# Patient Record
Sex: Female | Born: 1960 | Race: White | Hispanic: No | Marital: Married | Smoking: Never smoker
Health system: Southern US, Community
[De-identification: ages and names within clinical notes are randomized; demographics above are authoritative.]

## PROBLEM LIST (undated history)

## (undated) DIAGNOSIS — J45909 Unspecified asthma, uncomplicated: Secondary | ICD-10-CM

## (undated) DIAGNOSIS — C801 Malignant (primary) neoplasm, unspecified: Secondary | ICD-10-CM

## (undated) DIAGNOSIS — F419 Anxiety disorder, unspecified: Secondary | ICD-10-CM

## (undated) DIAGNOSIS — I10 Essential (primary) hypertension: Secondary | ICD-10-CM

## (undated) DIAGNOSIS — E785 Hyperlipidemia, unspecified: Secondary | ICD-10-CM

## (undated) DIAGNOSIS — T7840XA Allergy, unspecified, initial encounter: Secondary | ICD-10-CM

## (undated) HISTORY — PX: OTHER SURGICAL HISTORY: SHX169

## (undated) HISTORY — DX: Essential (primary) hypertension: I10

## (undated) HISTORY — DX: Allergy, unspecified, initial encounter: T78.40XA

## (undated) HISTORY — DX: Hyperlipidemia, unspecified: E78.5

## (undated) HISTORY — DX: Unspecified asthma, uncomplicated: J45.909

---

## 2000-03-15 HISTORY — PX: BREAST LUMPECTOMY: SHX2

## 2008-03-15 DIAGNOSIS — J189 Pneumonia, unspecified organism: Secondary | ICD-10-CM

## 2008-03-15 HISTORY — DX: Pneumonia, unspecified organism: J18.9

## 2021-01-19 ENCOUNTER — Encounter: Payer: Self-pay | Admitting: *Deleted

## 2021-01-19 ENCOUNTER — Telehealth: Payer: Self-pay | Admitting: Hematology and Oncology

## 2021-01-19 ENCOUNTER — Telehealth: Payer: Self-pay | Admitting: *Deleted

## 2021-01-19 NOTE — Telephone Encounter (Signed)
Confirmed [facility] for 01/21/21 at 1215 .  Instructions and contact information given.

## 2021-01-19 NOTE — Telephone Encounter (Signed)
Called patient on 11/3 and 11/7 and left message for patient to return call to confirm upcoming clinic appointment on 11/9 at 1215

## 2021-01-20 ENCOUNTER — Encounter: Payer: Self-pay | Admitting: *Deleted

## 2021-01-20 ENCOUNTER — Other Ambulatory Visit: Payer: Self-pay | Admitting: *Deleted

## 2021-01-20 DIAGNOSIS — C50412 Malignant neoplasm of upper-outer quadrant of left female breast: Secondary | ICD-10-CM | POA: Insufficient documentation

## 2021-01-20 NOTE — Progress Notes (Signed)
[facility] NOTE  Patient Care Team: Pcp, No as PCP - General [name], MD as Consulting Physician (General Surgery) [name], MD as Consulting Physician (Hematology and Oncology) Kyung Rudd, MD as Consulting Physician (Radiation Oncology)  CHIEF COMPLAINTS/PURPOSE OF CONSULTATION:  Newly diagnosed left breast cancer  HISTORY OF PRESENTING ILLNESS:  Leah Chan 60 y.o. female is here because of recent diagnosis of malignant neoplasm of UOQ of the left breast. She presents to the clinic today for initial evaluation and discussion of treatment options.  She denies any lumps or nodules in the breast.  Denies any pain or discomfort.  I reviewed her records extensively and collaborated the history with the patient.  SUMMARY OF ONCOLOGIC HISTORY: Oncology History  Malignant neoplasm of upper-outer quadrant of left breast in female, estrogen receptor negative ([place])  01/20/2021 Initial Diagnosis   Screening mammogram detected 2 asymmetric areas with calcifications detected in March 2022.  October 2022 mammogram revealed left breast calcifications 1.8 cm at 1 o'clock position and a mass below the skin measuring 4 mm.  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  MEDICATIONS:  Current Outpatient Medications  Medication Sig Dispense Refill   Calcium-Magnesium-Zinc 333-133-5 MG TABS Take 1 tablet by mouth.     escitalopram (LEXAPRO) 10 MG tablet Take 10 mg by mouth daily.     hydrochlorothiazide (HYDRODIURIL) 25 MG tablet Take 1 tablet by mouth  daily.     hydrOXYzine (ATARAX/VISTARIL) 25 MG tablet TAKE 1 AND 1/2 TABLETS BY MOUTH EVERY NIGHT FOR SLEEP     pravastatin (PRAVACHOL) 10 MG tablet Take 10 mg by mouth at bedtime.     No current facility-administered medications for this visit.    REVIEW OF SYSTEMS:     All other systems were reviewed with the patient and are negative.  PHYSICAL EXAMINATION: ECOG PERFORMANCE STATUS: 1 - Symptomatic but completely ambulatory  Vitals:   01/21/21 1304  BP: 139/76  Pulse: 68  Resp: 17  Temp: 97.8 F (36.6 C)  SpO2: 100%   Filed Weights   01/21/21 1304  Weight: 131 lb 11.2 oz (59.7 kg)      LABORATORY DATA:  I have reviewed the data as listed Lab Results  Component Value Date   WBC 6.9 01/21/2021   HGB 14.3 01/21/2021   HCT 41.9 01/21/2021   MCV 86.0 01/21/2021   PLT 222 01/21/2021   Lab Results  Component Value Date   NA 139 01/21/2021   K 3.7 01/21/2021   CL 101 01/21/2021   CO2 30 01/21/2021    RADIOGRAPHIC STUDIES: I have personally reviewed the radiological reports and agreed with the findings in the report.  ASSESSMENT AND PLAN:  Malignant neoplasm of upper-outer quadrant of left breast in female, estrogen receptor negative ([place]) Screening mammogram detected 2 asymmetric areas with calcifications detected in March 2022.  October 2022 mammogram revealed left breast calcifications 1.8 cm at 1 o'clock position and a mass below the skin measuring 4 mm.  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 After reviewing the pathology in detail, we proceeded to discuss the different treatment options between surgery, radiation, chemotherapy, antiestrogen  therapies.  Treatment plan: 1.  Await the final tumor size before determining if systemic chemotherapy would be necessary 2. adjuvant radiation  Genetic counseling and testing  Return to clinic after surgery to discuss the final pathology report.   All questions were answered. The patient knows to call the clinic with any problems, questions or concerns.   Rulon Eisenmenger, MD, MPH 01/21/2021    I, Thana Ates, am acting as scribe for [name], MD.  I have reviewed the above documentation for accuracy and completeness, and I agree with the above.

## 2021-01-21 ENCOUNTER — Encounter: Payer: Self-pay | Admitting: *Deleted

## 2021-01-21 ENCOUNTER — Ambulatory Visit (HOSPITAL_BASED_OUTPATIENT_CLINIC_OR_DEPARTMENT_OTHER): Payer: Managed Care, Other (non HMO) | Admitting: Genetic Counselor

## 2021-01-21 ENCOUNTER — Encounter: Payer: Self-pay | Admitting: Physical Therapy

## 2021-01-21 ENCOUNTER — Inpatient Hospital Stay: Payer: Managed Care, Other (non HMO) | Admitting: Hematology and Oncology

## 2021-01-21 ENCOUNTER — Inpatient Hospital Stay: Payer: Managed Care, Other (non HMO)

## 2021-01-21 ENCOUNTER — Ambulatory Visit: Payer: Managed Care, Other (non HMO) | Attending: Surgery | Admitting: Physical Therapy

## 2021-01-21 ENCOUNTER — Encounter: Payer: Self-pay | Admitting: General Practice

## 2021-01-21 ENCOUNTER — Other Ambulatory Visit: Payer: Self-pay

## 2021-01-21 ENCOUNTER — Ambulatory Visit
Admission: RE | Admit: 2021-01-21 | Discharge: 2021-01-21 | Disposition: A | Discharge status: Home / Self Care | Payer: Managed Care, Other (non HMO) | Source: Ambulatory Visit | Attending: Radiation Oncology | Admitting: Radiation Oncology

## 2021-01-21 ENCOUNTER — Other Ambulatory Visit: Payer: Self-pay | Admitting: Surgery

## 2021-01-21 DIAGNOSIS — Z8042 Family history of malignant neoplasm of prostate: Secondary | ICD-10-CM

## 2021-01-21 DIAGNOSIS — J45909 Unspecified asthma, uncomplicated: Secondary | ICD-10-CM | POA: Insufficient documentation

## 2021-01-21 DIAGNOSIS — Z171 Estrogen receptor negative status [ER-]: Secondary | ICD-10-CM

## 2021-01-21 DIAGNOSIS — C50412 Malignant neoplasm of upper-outer quadrant of left female breast: Secondary | ICD-10-CM | POA: Insufficient documentation

## 2021-01-21 DIAGNOSIS — R293 Abnormal posture: Secondary | ICD-10-CM | POA: Diagnosis present

## 2021-01-21 DIAGNOSIS — Z79899 Other long term (current) drug therapy: Secondary | ICD-10-CM | POA: Insufficient documentation

## 2021-01-21 DIAGNOSIS — Z923 Personal history of irradiation: Secondary | ICD-10-CM | POA: Insufficient documentation

## 2021-01-21 DIAGNOSIS — Z170 Estrogen receptor positive status [ER+]: Secondary | ICD-10-CM | POA: Diagnosis present

## 2021-01-21 DIAGNOSIS — I10 Essential (primary) hypertension: Secondary | ICD-10-CM | POA: Insufficient documentation

## 2021-01-21 DIAGNOSIS — Z808 Family history of malignant neoplasm of other organs or systems: Secondary | ICD-10-CM | POA: Diagnosis not present

## 2021-01-21 DIAGNOSIS — Z853 Personal history of malignant neoplasm of breast: Secondary | ICD-10-CM

## 2021-01-21 LAB — CBC WITH DIFFERENTIAL (CANCER CENTER ONLY)
Abs Immature Granulocytes: 0.01 10*3/uL (ref 0.00–0.07)
Basophils Absolute: 0.1 10*3/uL (ref 0.0–0.1)
Basophils Relative: 1 %
Eosinophils Absolute: 0.1 10*3/uL (ref 0.0–0.5)
Eosinophils Relative: 2 %
HCT: 41.9 % (ref 36.0–46.0)
Hemoglobin: 14.3 g/dL (ref 12.0–15.0)
Immature Granulocytes: 0 %
Lymphocytes Relative: 27 %
Lymphs Abs: 1.8 10*3/uL (ref 0.7–4.0)
MCH: 29.4 pg (ref 26.0–34.0)
MCHC: 34.1 g/dL (ref 30.0–36.0)
MCV: 86 fL (ref 80.0–100.0)
Monocytes Absolute: 0.4 10*3/uL (ref 0.1–1.0)
Monocytes Relative: 6 %
Neutro Abs: 4.4 10*3/uL (ref 1.7–7.7)
Neutrophils Relative %: 64 %
Platelet Count: 222 10*3/uL (ref 150–400)
RBC: 4.87 MIL/uL (ref 3.87–5.11)
RDW: 13.2 % (ref 11.5–15.5)
WBC Count: 6.9 10*3/uL (ref 4.0–10.5)
nRBC: 0 % (ref 0.0–0.2)

## 2021-01-21 LAB — CMP (CANCER CENTER ONLY)
ALT: 16 U/L (ref 0–44)
AST: 22 U/L (ref 15–41)
Albumin: 4.4 g/dL (ref 3.5–5.0)
Alkaline Phosphatase: 76 U/L (ref 38–126)
Anion gap: 8 (ref 5–15)
BUN: 11 mg/dL (ref 8–23)
CO2: 30 mmol/L (ref 22–32)
Calcium: 9.5 mg/dL (ref 8.9–10.3)
Chloride: 101 mmol/L (ref 98–111)
Creatinine: 0.87 mg/dL (ref 0.44–1.00)
GFR, Estimated: >60 mL/min (ref >60)
Glucose, Bld: 78 mg/dL (ref 70–99)
Potassium: 3.7 mmol/L (ref 3.5–5.1)
Sodium: 139 mmol/L (ref 135–145)
Total Bilirubin: 1.1 mg/dL (ref 0.3–1.2)
Total Protein: 7.5 g/dL (ref 6.5–8.1)

## 2021-01-21 LAB — GENETIC SCREENING ORDER

## 2021-01-21 NOTE — Assessment & Plan Note (Signed)
Screening mammogram detected 2 asymmetric areas with calcifications detected in March 2022.  October 2022 mammogram revealed left breast calcifications 1.8 cm at 1 o'clock position and a mass below the skin measuring 4 mm.  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 After reviewing the pathology in detail, we proceeded to discuss the different treatment options between surgery, radiation, chemotherapy, antiestrogen therapies.  Treatment plan: 1.  Await the final tumor size before determining if systemic chemotherapy would be necessary 2. adjuvant radiation  Genetic counseling and testing  Return to clinic after surgery to discuss the final pathology report.

## 2021-01-21 NOTE — Patient Instructions (Signed)

## 2021-01-21 NOTE — Progress Notes (Signed)
[place] Psychosocial Distress Screening Spiritual Care  Met with Leah Chan and her sister [name] in [facility] to introduce [facility] team/resources, reviewing distress screen per protocol.  The patient scored a 8 on the Psychosocial Distress Thermometer which indicates severe distress. Also assessed for distress and other psychosocial needs.   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 She notes that her work is the only source of income for her household, so potentially missing work due to treatment is a stressor.   Follow up needed: Yes.  We plan for me to consult Social Work regarding potential financial resources and to phone her with suggestions next week.   [place], [place], [facility] Pager [phone] Voicemail [phone]

## 2021-01-21 NOTE — Therapy (Signed)
[facility] @ [place], [place], [address] Phone: [phone]   Fax:  [phone]  Physical Therapy Evaluation  Patient Details  Name: Leah Chan MRN: [id number] Date of Birth: [birth year] Referring Provider (PT): Dr. [name]   Encounter Date: 01/21/2021   PT End of Session - 01/21/21 1527     Visit Number 1    Number of Visits 2    Date for PT Re-Evaluation 03/18/21    PT Start Time 1430    PT Stop Time 1500    PT Time Calculation (min) 30 min    Activity Tolerance Patient tolerated treatment well    Behavior During Therapy [facility] for tasks assessed/performed             Past Medical History:  Diagnosis Date   Asthma    Hypertension     History reviewed. No pertinent surgical history.  There were no vitals filed for this visit.    Subjective Assessment - 01/21/21 1519     Subjective Patient reports she is here today to be seen by her medical team for her newly diagnosed left breast cancer.    Patient is accompained by: Family member    Pertinent History Patient was diagnosed on 12/25/2020 with left grade III invasive ductal carcinoma breast cancer. The mass measures 4 mm and there are 1.5 cm of calcifications in the upper outer quadrant. It is triple negative with a Ki67 of 15%.    Patient Stated Goals Reduce lymphedema risk and learn post op HEP    Currently in Pain? Yes    Pain Score 7     Pain Location Back    Pain Orientation Right    Pain Descriptors / Indicators Aching    Pain Type Chronic pain    Pain Onset More than a month ago    Pain Frequency Intermittent    Aggravating Factors  Walking and bending    Pain Relieving Factors Resting                [facility] PT Assessment - 01/21/21 0001       Assessment   Medical Diagnosis Left breast cancer    Referring Provider (PT) Dr. [name]    Onset Date/Surgical Date 12/25/20    Hand Dominance Right    Prior  Therapy none      Precautions   Precautions Other (comment)    Precaution Comments active cancer      Restrictions   Weight Bearing Restrictions No      Balance Screen   Has the patient fallen in the past 6 months No    Has the patient had a decrease in activity level because of a fear of falling?  No    Is the patient reluctant to leave their home because of a fear of falling?  No      Home Ecologist residence    Living Arrangements Spouse/significant other;Other relatives   Husband and 58 y.o. grandson   Available Help at Discharge Family      Prior Function   Level of Independence Independent    Vocation Full time employment    Medical laboratory scientific officer; very physical work    Leisure She does not exercise      Cognition   Overall Cognitive Status Within Functional Limits for tasks assessed      Posture/Postural Control   Posture/Postural Control Postural limitations  Postural Limitations Rounded Shoulders;Forward head      ROM / Strength   AROM / PROM / Strength AROM;Strength      AROM   Overall AROM Comments Cervical AROM is WNL    AROM Assessment Site Shoulder    Right/Left Shoulder Right;Left    Right Shoulder Extension 50 Degrees    Right Shoulder Flexion 148 Degrees    Right Shoulder ABduction 159 Degrees    Right Shoulder Internal Rotation 72 Degrees    Right Shoulder External Rotation 88 Degrees    Left Shoulder Extension 54 Degrees    Left Shoulder Flexion 144 Degrees    Left Shoulder ABduction 159 Degrees    Left Shoulder Internal Rotation 63 Degrees    Left Shoulder External Rotation 81 Degrees      Strength   Overall Strength Within functional limits for tasks performed               LYMPHEDEMA/ONCOLOGY QUESTIONNAIRE - 01/21/21 0001       Type   Cancer Type Left breast cancer      Lymphedema Assessments   Lymphedema Assessments Upper extremities      Right Upper Extremity Lymphedema    10 cm Proximal to Olecranon Process 26.1 cm    Olecranon Process 24 cm    10 cm Proximal to Ulnar Styloid Process 20.6 cm    Just Proximal to Ulnar Styloid Process 15.6 cm    Across Hand at [employer] 19.2 cm    At [place] of 2nd Digit 6.5 cm      Left Upper Extremity Lymphedema   10 cm Proximal to Olecranon Process 25.7 cm    Olecranon Process 23.6 cm    10 cm Proximal to Ulnar Styloid Process 20.4 cm    Just Proximal to Ulnar Styloid Process 15.3 cm    Across Hand at [employer] 18.8 cm    At [place] of 2nd Digit 6.4 cm             L-DEX FLOWSHEETS - 01/21/21 1500       L-DEX LYMPHEDEMA SCREENING   Measurement Type Unilateral    L-DEX MEASUREMENT EXTREMITY Upper Extremity    POSITION  Standing    DOMINANT SIDE Right    At Risk Side Left    BASELINE SCORE (UNILATERAL) 1.8                    Objective measurements completed on examination: See above findings.      Patient was instructed today in a home exercise program today for post op shoulder range of motion. These included active assist shoulder flexion in sitting, scapular retraction, wall walking with shoulder abduction, and hands behind head external rotation.  She was encouraged to do these twice a day, holding 3 seconds and repeating 5 times when permitted by her physician.            PT Education - 01/21/21 1526     Education Details Lymphedema education and post op HEP    Person(s) Educated Patient;Other (comment)   sister   Methods Explanation;Demonstration;Handout    Comprehension Returned demonstration;Verbalized understanding                 PT Long Term Goals - 01/21/21 1527       PT LONG TERM GOAL #1   Title Patient will demonstrate she has regained full shoulder ROM and function post operatively compared to baselines.    Time 8  Period Weeks    Status New    Target Date 03/18/21                    Plan - 01/21/21 1527     Clinical Impression  Statement Patient was diagnosed on 12/25/2020 with left grade III invasive ductal carcinoma breast cancer. The mass measures 4 mm and there are 1.5 cm of calcifications in the upper outer quadrant. It is triple negative with a Ki67 of 15%. Her multidisciplinary medical team met prior to her assessments to determine a recommended treatment plan. She is planning to have a left lumpectomy and sentinel node biopsy followed by chemotherapy if > 5 mm and radiation. She will benefit from post op PT reassessment to determine needs and from L-Dex screens every 3 months for 2 years to detect subclinical lymphedema.    Stability/Clinical Decision Making Stable/Uncomplicated    Clinical Decision Making Low    Rehab Potential Excellent    PT Frequency --   Eval and 1 f/u visit   PT Treatment/Interventions ADLs/Self Care Home Management;Therapeutic exercise;Patient/family education    PT Next Visit Plan Will reassess 3-4 weeks post op    PT Home Exercise Plan Post op HEP    Consulted and Agree with Plan of Care Patient;Family member/caregiver    Family Member Consulted sister             Patient will benefit from skilled therapeutic intervention in order to improve the following deficits and impairments:  Postural dysfunction, Decreased range of motion, Decreased knowledge of precautions, Impaired UE functional use, Pain  Visit Diagnosis: Malignant neoplasm of upper-outer quadrant of left breast in female, estrogen receptor positive ([place]) - Plan: PT plan of care cert/re-cert  Abnormal posture - Plan: PT plan of care cert/re-cert  Patient will follow up at outpatient cancer rehab 3-4 weeks following surgery.  If the patient requires physical therapy at that time, a specific plan will be dictated and sent to the referring physician for approval. The patient was educated today on appropriate basic range of motion exercises to begin post operatively and the importance of attending the After Breast Cancer  class following surgery.  Patient was educated today on lymphedema risk reduction practices as it pertains to recommendations that will benefit the patient immediately following surgery.  She verbalized good understanding.      Problem List Patient Active Problem List   Diagnosis Date Noted   Malignant neoplasm of upper-outer quadrant of left breast in female, estrogen receptor negative ([place]) 01/20/2021   Annia Friendly, PT 01/21/21 3:41 PM   [facility] @ [place], [place], [address] Phone: [phone]   Fax:  [phone]  Name: Leah Chan MRN: [id number] Date of Birth: 12-10-1960

## 2021-01-22 ENCOUNTER — Encounter: Payer: Self-pay | Admitting: Genetic Counselor

## 2021-01-22 ENCOUNTER — Telehealth: Payer: Self-pay | Admitting: General Practice

## 2021-01-22 DIAGNOSIS — Z808 Family history of malignant neoplasm of other organs or systems: Secondary | ICD-10-CM

## 2021-01-22 DIAGNOSIS — Z8042 Family history of malignant neoplasm of prostate: Secondary | ICD-10-CM

## 2021-01-22 HISTORY — DX: Family history of malignant neoplasm of other organs or systems: Z80.8

## 2021-01-22 HISTORY — DX: Family history of malignant neoplasm of prostate: Z80.42

## 2021-01-22 NOTE — Progress Notes (Addendum)
Radiation Oncology         [phone]) [phone] ________________________________  Name: Leah Chan MRN: [id number]  Date: 01/21/2021  DOB: 17-Oct-1960  CC:Pcp, No  [name], MD     REFERRING PHYSICIAN: [name], MD   DIAGNOSIS: The encounter diagnosis was Malignant neoplasm of upper-outer quadrant of left breast in female, estrogen receptor negative ([place]).   HISTORY OF PRESENT ILLNESS::Leah Chan is a 60 y.o. female who is seen for an initial consultation visit regarding the patient's diagnosis of breast cancer.  The patient was found to have a couple of areas of calcification within the left breast on screening mammogram.  Further imaging work-up revealed a 1.8 cm tumor as well as a separate 0.4 cm mass.  No axillary adenopathy was seen on ultrasound.  Biopsy was performed of the larger area and this returned positive for a grade 3 ductal carcinoma in situ.  Receptor studies indicated that the tumor is estrogen receptor negative and progesterone receptor negative.  A smaller 0.4 cm mass returned positive for a grade 3 invasive ductal carcinoma which was triple negative.  The Ki-67 was 15%.  The patient has been seen in multidisciplinary breast clinic and is felt to potentially be a good candidate for lumpectomy.  I have been asked to see the patient for consideration of radiation treatment.    PREVIOUS RADIATION THERAPY: No   PAST MEDICAL HISTORY:  has a past medical history of Asthma, Family history of prostate cancer (01/22/2021), Family history of skin cancer (01/22/2021), and Hypertension.     PAST SURGICAL HISTORY:No past surgical history on file.   FAMILY HISTORY: family history includes Prostate cancer in her father and maternal uncle.   SOCIAL HISTORY:  reports that she has never smoked. She does not have any smokeless tobacco history on file. She reports that she does not use drugs.   ALLERGIES: Patient has no known allergies.   MEDICATIONS:  Current  Outpatient Medications  Medication Sig Dispense Refill   Calcium-Magnesium-Zinc 333-133-5 MG TABS Take 1 tablet by mouth.     escitalopram (LEXAPRO) 10 MG tablet Take 10 mg by mouth daily.     hydrochlorothiazide (HYDRODIURIL) 25 MG tablet Take 1 tablet by mouth daily.     hydrOXYzine (ATARAX/VISTARIL) 25 MG tablet TAKE 1 AND 1/2 TABLETS BY MOUTH EVERY NIGHT FOR SLEEP     pravastatin (PRAVACHOL) 10 MG tablet Take 10 mg by mouth at bedtime.     No current facility-administered medications for this encounter.     REVIEW OF SYSTEMS:  A 15 point review of systems is documented in the electronic medical record. This was obtained by the nursing staff. However, I reviewed this with the patient to discuss relevant findings and make appropriate changes.  Pertinent items are noted in HPI.    PHYSICAL EXAM:  vitals were not taken for this visit.   ECOG = 0  0 - Asymptomatic (Fully active, able to carry on all predisease activities without restriction)  1 - Symptomatic but completely ambulatory (Restricted in physically strenuous activity but ambulatory and able to carry out work of a light or sedentary nature. For example, light housework, office work)  2 - Symptomatic, <50% in bed during the day (Ambulatory and capable of all self care but unable to carry out any work activities. Up and about more than 50% of waking hours)  3 - Symptomatic, >50% in bed, but not bedbound (Capable of only limited self-care, confined to bed or chair 50% or more of  waking hours)  4 - Bedbound (Completely disabled. Cannot carry on any self-care. Totally confined to bed or chair)  5 - Death   Eustace Pen MM, Creech RH, Tormey DC, et al. [phone]). "Toxicity and response criteria of the [facility]". [place] Oncol. 5 (6): 649-55  Alert, no distress   LABORATORY DATA:  Lab Results  Component Value Date   WBC 6.9 01/21/2021   HGB 14.3 01/21/2021   HCT 41.9 01/21/2021   MCV 86.0 01/21/2021    PLT 222 01/21/2021   Lab Results  Component Value Date   NA 139 01/21/2021   K 3.7 01/21/2021   CL 101 01/21/2021   CO2 30 01/21/2021   Lab Results  Component Value Date   ALT 16 01/21/2021   AST 22 01/21/2021   ALKPHOS 76 01/21/2021   BILITOT 1.1 01/21/2021      RADIOGRAPHY: No results found.     IMPRESSION/ PLAN:  The patient was seen today in multidisciplinary breast clinic.  She has both a larger DCIS as well as a smaller triple negative invasive ductal carcinoma which has been found.  She is felt to be a good candidate for lumpectomy.  She may not need chemotherapy if the tumor on final pathology remains quite small.  I therefore discussed the recommendation to proceed with adjuvant radiation treatment at the appropriate time.  We discussed the rationale for this treatment as well as possible side effects and risks.  All of the patient's questions were answered and she indicated that she does wish to proceed with this treatment at the appropriate time.  I look forward to seeing the patient postoperatively to further coordinate her care.  Staging:  left dcis; left IDC triple negative [id number]  The patient was seen in person today in clinic.  The total time spent on the patient's visit today was 45 minutes, including chart review, direct discussion/evaluation with the patient, and coordination of care.      ________________________________   Jodelle Gross, MD, PhD   **Disclaimer: This note was dictated with voice recognition software. Similar sounding words can inadvertently be transcribed and this note may contain transcription errors which may not have been corrected upon publication of note.**

## 2021-01-22 NOTE — Progress Notes (Signed)
REFERRING PROVIDER: [name], MD [place],  [place] [address]  PRIMARY PROVIDER:  None listed  PRIMARY REASON FOR VISIT:  1. Malignant neoplasm of upper-outer quadrant of left breast in female, estrogen receptor negative ([place])   2. Family history of prostate cancer   3. Family history of skin cancer     HISTORY OF PRESENT ILLNESS:   Leah Chan, a 60 y.o. female, was seen for a [place] cancer genetics consultation during the breast multidisciplinary clinic at the request of Dr. Lindi Adie due to a personal and family history of cancer.  Leah Chan presents to clinic today to discuss the possibility of a hereditary predisposition to cancer, to discuss genetic testing, and to further clarify her future cancer risks, as well as potential cancer risks for family members.   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 The treatment plan is pending.   CANCER HISTORY:  Oncology History  Malignant neoplasm of upper-outer quadrant of left breast in female, estrogen receptor negative ([place])  01/20/2021 Initial Diagnosis   Screening mammogram detected 2 asymmetric areas with calcifications detected in March 2022.  October 2022 mammogram revealed left breast calcifications 1.8 cm at 1 o'clock position and a mass below the skin measuring 4 mm.  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  First live birth at age 79.  Menopausal status: postmenopausal.  HRT use: 0 years. Colonoscopy: yes; most recent unknown. Mammogram within the last year: yes. Up to date with pelvic exams: most recent PAP in 2020.   Past Medical History:  Diagnosis Date   Asthma    Family history of prostate cancer 01/22/2021   Family history of skin cancer 01/22/2021   Hypertension     No  past surgical history on file.  Social History   Socioeconomic History   Marital status: Married    Spouse name: Not on file   Number of children: Not on file   Years of education: Not on file   Highest education level: Not on file  Occupational History   Not on file  Tobacco Use   Smoking status: Never   Smokeless tobacco: Not on file  Substance and Sexual Activity   Alcohol use: Not on file   Drug use: Never   Sexual activity: Not on file  Other Topics Concern   Not on file  Social History Narrative   Not on file   Social Determinants of Health   Financial Resource Strain: Not on file  Food Insecurity: Not on file  Transportation Needs: Not on file  Physical Activity: Not on file  Stress: Not on file  Social Connections: Not on file     FAMILY HISTORY:  We obtained a detailed, 4-generation family history.  Significant diagnoses are listed below: Family History  Problem Relation Age of Onset   Prostate cancer Father        mets; d. 81   Prostate cancer Maternal Uncle        dx after 44     Leah Chan is unaware of previous family history of genetic testing for hereditary cancer risks. There is no reported Ashkenazi Jewish ancestry. There is no known consanguinity.  GENETIC COUNSELING ASSESSMENT: Leah Chan is a 60 y.o. female with a personal and family history of  cancer which is somewhat suggestive of a hereditary cancer syndrome and predisposition to cancer given her triple negative status and the presence of metastatic prostate cancer in the family. We, therefore, discussed and recommended the following at today's visit.   DISCUSSION: We discussed that 5 - 10% of cancer is hereditary, with most cases of hereditary breast cancer associated with mutations in BRCA1/2.  There are other genes that can be associated with hereditary breast and prostate cancer syndromes.  Type of cancer risk and level of risk are gene-specific. We discussed that testing is beneficial for  several reasons including knowing how to follow individuals after completing their treatment, identifying whether potential treatment options would be beneficial, and understanding if other family members could be at risk for cancer and allowing them to undergo genetic testing.   We reviewed the characteristics, features and inheritance patterns of hereditary cancer syndromes. We also discussed genetic testing, including the appropriate family members to test, the process of testing, insurance coverage and turn-around-time for results. We discussed the implications of a negative, positive and/or variant of uncertain significant result. In order to get genetic test results in a timely manner so that Leah Chan can use these genetic test results for surgical decisions, we recommended Leah Chan pursue genetic testing for the [employer].  The BRCAplus panel offered by [employer] and includes sequencing and deletion/duplication analysis for the following 8 genes: ATM, BRCA1, BRCA2, CDH1, CHEK2, PALB2, PTEN, and TP53. Once complete, we recommend Leah Chan pursue reflex genetic testing to a more comprehensive gene panel.   Leah Chan  was offered a common hereditary cancer panel (~50 genes) and an expanded pan-cancer panel (~80 genes). Leah Chan was informed of the benefits and limitations of each panel, including that expanded pan-cancer panels contain genes that do not have clear management guidelines at this point in time.  We also discussed that as the number of genes included on a panel increases, the chances of variants of uncertain significance increases.  After considering the benefits and limitations of each gene panel, Leah Chan  elected to have a common hereditary cancres panel through [place].  The CustomNext-Cancer+RNAinsight panel offered by [facility] includes sequencing and rearrangement analysis for the following 52 genes:  APC, ATM, AXIN2, BAP1, BARD1, BMPR1A, BRCA1, BRCA2,  BRIP1, CDH1, CDK4, CDKN2A, CHEK2, DICER1, EPCAM, GREM1, HOXB13, MEN1, MITF, MLH1, MSH2, MSH3, MSH6, MUTYH, NBN, NF1, NF2, NTHL1, PALB2, PMS2, POLD1, POLE, POT1, PTCH1, PTEN, RAD51C, RAD51D, RECQL, RET, SDHA, SDHAF2, SDHB, SDHC, SDHD, SMAD4, SMARCA4, STK11, SUFU, TP53, TSC1, TSC2, and VHL.  RNA data is routinely analyzed for use in variant interpretation for all genes.  Based on Leah Chan's personal history of triple negative breast cancer, she meets medical criteria for genetic testing. Despite that she meets criteria, she may still have an out of pocket cost. We discussed that if her out of pocket cost for testing is over $100, the laboratory should contact them to discuss self-pay prices, patient pay assistance programs, if applicable, and other billing options.   PLAN: After considering the risks, benefits, and limitations, Leah Chan provided informed consent to pursue genetic testing and the blood sample was sent to [facility] for analysis of the [facility] and CustomNext-Cancer +RNAinsight Panel. Results should be available within approximately 1-2 weeks' time, at which point they will be disclosed by telephone to Leah Chan, as will any additional recommendations warranted by these results. Leah Chan will receive a summary of her genetic counseling visit and a  copy of her results once available. This information will also be available in Epic.   Lastly, we encouraged Leah Chan to remain in contact with cancer genetics annually so that we can continuously update the family history and inform her of any changes in cancer genetics and testing that may be of benefit for this family.   Leah Chan questions were answered to her satisfaction today. Our contact information was provided should additional questions or concerns arise. Thank you for the referral and allowing [place] to share in the care of your patient.   Yocelin Vanlue M. Joette Catching, [place], [facility] Genetic Counselor [email or web address] (P)  [phone]  The patient was seen for a total of 20 minutes in face-to-face genetic counseling.  The patient was accompanied by her sister, [name].  Drs. Magrinat, Lindi Adie and/or Burr Medico were available to discuss this case as needed.  _______________________________________________________________________ For Office Staff:  Number of people involved in session: 2 Was an Intern/ student involved with case: no

## 2021-01-22 NOTE — Telephone Encounter (Signed)
[place] CSW Progress Notes  Request from Vanetta Shawl to help patient find financial assistance resources as she will be out of work due to surgery/treatment for her breast cancer.  Called patient, left generic VM as VM was not personally identified, encouraged her to return call to get help w any available resources.  CSW also suggested SW intern A Owens Shark may be able to work w patient on applications for assistance, intern will connect with patient next week.  Edwyna Shell, LCSW Clinical Social Worker Phone:  [phone]

## 2021-01-26 ENCOUNTER — Encounter: Payer: Self-pay | Admitting: General Practice

## 2021-01-26 NOTE — Progress Notes (Signed)
[place] Spiritual Care Note  Attempted follow-up to let Leah Chan know that Social Work will be in touch regarding her questions. Left voicemail, encouraging return call.   [place], [place], [facility] Pager [phone] Voicemail [phone]

## 2021-01-27 ENCOUNTER — Encounter: Payer: Self-pay | Admitting: *Deleted

## 2021-01-27 ENCOUNTER — Telehealth: Payer: Self-pay | Admitting: *Deleted

## 2021-01-27 NOTE — Telephone Encounter (Signed)
Spoke with patient from [facility] 11/9 to follow up and assess navigation needs.  Patient denies any questions or concerns at this time. Encouraged patient to call should anything arise. Patient verbalized understanding.

## 2021-01-28 ENCOUNTER — Telehealth: Payer: Self-pay | Admitting: Hematology and Oncology

## 2021-01-28 NOTE — Telephone Encounter (Signed)
Scheduled per sch msg. Called and spoke with patient. Confirmed appt  

## 2021-01-30 NOTE — Pre-Procedure Instructions (Addendum)
Surgical Instructions    Your procedure is scheduled on Monday 02/09/21.   Report to [facility] Main Entrance "A" at 1:30 P.M., then check in with the Admitting office.  Call this number if you have problems the morning of surgery:  [phone]   If you have any questions prior to your surgery date call [phone]: Open Monday-Friday 8am-4pm    Remember:  Do not eat after midnight the night before your surgery  You may drink clear liquids until 12:30 P.M. the morning of your surgery.   Clear liquids allowed are: Water, Non-Citrus Juices (without pulp), Carbonated Beverages, Clear Tea, Black Coffee ONLY (NO MILK, CREAM OR POWDERED CREAMER of any kind), and Gatorade  Patient Instructions  The night before surgery:  No food after midnight. ONLY clear liquids after midnight  The day of surgery (if you do NOT have diabetes):  Drink ONE (1) Pre-Surgery Clear Ensure by 12:30 P.M. the morning of surgery. Drink in one sitting. Do not sip.  This drink was given to you during your hospital  pre-op appointment visit.  Nothing else to drink after completing the  Pre-Surgery Clear Ensure.         If you have questions, please contact your surgeon's office.     Take these medicines the morning of surgery with A SIP OF WATER   escitalopram (LEXAPRO)    As of today, STOP taking any Aspirin (unless otherwise instructed by your surgeon) Aleve, Naproxen, Ibuprofen, Motrin, Advil, Goody's, BC's, all herbal medications, fish oil, and all vitamins.     After your COVID test   You are not required to quarantine however you are required to wear a well-fitting mask when you are out and around people not in your household.  If your mask becomes wet or soiled, replace with a new one.  Wash your hands often with soap and water for 20 seconds or clean your hands with an alcohol-based hand sanitizer that contains at least 60% alcohol.  Do not share personal items.  Notify your provider: if  you are in close contact with someone who has COVID  or if you develop a fever of 100.4 or greater, sneezing, cough, sore throat, shortness of breath or body aches.             Do not wear jewelry or makeup Do not wear lotions, powders, perfumes/colognes, or deodorant. Do not shave 48 hours prior to surgery.  Men may shave face and neck. Do not bring valuables to the hospital. DO Not wear nail polish, gel polish, artificial nails, or any other type of covering on natural nails including finger and toenails. If patients have artificial nails, gel coating, etc. that need to be removed by a nail salon, please have this removed prior to surgery or surgery may need to be canceled/delayed if the surgeon/ anesthesia feels like the patient is unable to be adequately monitored.             [facility] is not responsible for any belongings or valuables.  Do NOT Smoke (Tobacco/Vaping)  24 hours prior to your procedure  If you use a CPAP at night, you may bring your mask for your overnight stay.   Contacts, glasses, hearing aids, dentures or partials may not be worn into surgery, please bring cases for these belongings   For patients admitted to the hospital, discharge time will be determined by your treatment team.   Patients discharged the day of surgery will not be allowed to  drive home, and someone needs to stay with them for 24 hours.  NO VISITORS WILL BE ALLOWED IN PRE-OP WHERE PATIENTS ARE PREPPED FOR SURGERY.  ONLY 1 SUPPORT PERSON MAY BE PRESENT IN THE WAITING ROOM WHILE YOU ARE IN SURGERY.  IF YOU ARE TO BE ADMITTED, ONCE YOU ARE IN YOUR ROOM YOU WILL BE ALLOWED TWO (2) VISITORS. 1 (ONE) VISITOR MAY STAY OVERNIGHT BUT MUST ARRIVE TO THE ROOM BY 8pm.  Minor children may have two parents present. Special consideration for safety and communication needs will be reviewed on a case by case basis.  Special instructions:    Oral Hygiene is also important to reduce your risk of infection.   Remember - BRUSH YOUR TEETH THE MORNING OF SURGERY WITH YOUR REGULAR TOOTHPASTE   [facility]- Preparing For Surgery  Before surgery, you can play an important role. Because skin is not sterile, your skin needs to be as free of germs as possible. You can reduce the number of germs on your skin by washing with CHG (chlorahexidine gluconate) Soap before surgery.  CHG is an antiseptic cleaner which kills germs and bonds with the skin to continue killing germs even after washing.     Please do not use if you have an allergy to CHG or antibacterial soaps. If your skin becomes reddened/irritated stop using the CHG.  Do not shave (including legs and underarms) for at least 48 hours prior to first CHG shower. It is OK to shave your face.  Please follow these instructions carefully.     Shower the NIGHT BEFORE SURGERY and the MORNING OF SURGERY with CHG Soap.   If you chose to wash your hair, wash your hair first as usual with your normal shampoo. After you shampoo, rinse your hair and body thoroughly to remove the shampoo.  Then [employer] and genitals (private parts) with your normal soap and rinse thoroughly to remove soap.  After that Use CHG Soap as you would any other liquid soap. You can apply CHG directly to the skin and wash gently with a scrungie or a clean washcloth.   Apply the CHG Soap to your body ONLY FROM THE NECK DOWN.  Do not use on open wounds or open sores. Avoid contact with your eyes, ears, mouth and genitals (private parts). Wash Face and genitals (private parts)  with your normal soap.   Wash thoroughly, paying special attention to the area where your surgery will be performed.  Thoroughly rinse your body with warm water from the neck down.  DO NOT shower/wash with your normal soap after using and rinsing off the CHG Soap.  Pat yourself dry with a CLEAN TOWEL.  Wear CLEAN PAJAMAS to bed the night before surgery  Place CLEAN SHEETS on your bed the night before your  surgery  DO NOT SLEEP WITH PETS.   Day of Surgery:  Take a shower with CHG soap. Wear Clean/Comfortable clothing the morning of surgery Do not apply any deodorants/lotions.   Remember to brush your teeth WITH YOUR REGULAR TOOTHPASTE.   Please read over the following fact sheets that you were given.

## 2021-02-02 ENCOUNTER — Encounter (HOSPITAL_COMMUNITY): Payer: Self-pay

## 2021-02-02 ENCOUNTER — Encounter (HOSPITAL_COMMUNITY)
Admission: RE | Admit: 2021-02-02 | Discharge: 2021-02-02 | Disposition: A | Discharge status: Home / Self Care | Payer: Managed Care, Other (non HMO) | Source: Ambulatory Visit | Attending: Surgery | Admitting: Surgery

## 2021-02-02 ENCOUNTER — Encounter: Payer: Self-pay | Admitting: Genetic Counselor

## 2021-02-02 ENCOUNTER — Telehealth: Payer: Self-pay | Admitting: Genetic Counselor

## 2021-02-02 ENCOUNTER — Other Ambulatory Visit: Payer: Self-pay

## 2021-02-02 DIAGNOSIS — Z01810 Encounter for preprocedural cardiovascular examination: Secondary | ICD-10-CM | POA: Diagnosis present

## 2021-02-02 DIAGNOSIS — Z1379 Encounter for other screening for genetic and chromosomal anomalies: Secondary | ICD-10-CM

## 2021-02-02 HISTORY — DX: Malignant (primary) neoplasm, unspecified: C80.1

## 2021-02-02 HISTORY — DX: Anxiety disorder, unspecified: F41.9

## 2021-02-02 HISTORY — DX: Encounter for other screening for genetic and chromosomal anomalies: Z13.79

## 2021-02-02 NOTE — Progress Notes (Addendum)
PCP - Dr. Lucita Lora- [place]. Records requested Cardiologist - denies Oncologist- Dr. [name]  PPM/ICD - n/a Device Orders - n/a Rep Notified - n/a  Chest x-ray - n/a EKG - 02/02/21 Stress Test - denies ECHO - denies Cardiac Cath - denies  Sleep Study - denies CPAP - n/a  Fasting Blood Sugar - n/a Checks Blood Sugar _____ times a day- n/a  Blood Thinner Instructions: n/a Aspirin Instructions: n/a  ERAS Protcol - Yes PRE-SURGERY Ensure or G2- Ensure  COVID TEST- No. Ambulatory Surgery   Anesthesia review: Yes. Seed Lumpectomy  Patient denies shortness of breath, fever, cough and chest pain at PAT appointment   All instructions explained to the patient, with a verbal understanding of the material. Patient agrees to go over the instructions while at home for a better understanding. The opportunity to ask questions was provided.

## 2021-02-02 NOTE — Telephone Encounter (Addendum)
Revealed negative genetic testing.  Discussed that we do not know why she has breast cancer. It could be due to a different gene that we are not testing, or maybe our current technology may not be able to pick something up.  It will be important for her to keep in contact with genetics to keep up with whether additional testing may be needed.  Results of pan-cancer panel are pending.

## 2021-02-08 NOTE — H&P (Addendum)
MRN: [id number] DOB: May 13, 1960 DATE OF ENCOUNTER: 01/21/2021 Subjective  Chief Complaint: Breast Cancer     History of Present Illness: Leah Chan is a 60 y.o. female who is seen today as an office consultation  for evaluation of Breast Cancer  .     This is a 60 year old female who was found on recent screening mammography 2 separate areas in the left breast of concern.  She had a small 4 mm mass at the 12 o'clock position of the left breast and a 1.8 cm area of calcifications at the 1 o'clock position of the left breast   A biopsy of the small mass showed invasive ductal carcinoma.  The calcifications were biopsied showing DCIS.  Both were ER/PR negative, HER2 negative, and a Ki-67 was 50% on the invasive cancer.  Ultrasound of the axilla is unremarkable.  The total span of the area measured 2.5 cm in the breast.   She is otherwise healthy without complaints.  He has no cardiopulmonary issues.  She denies nipple discharge.   Review of Systems: A complete review of systems was obtained from the patient.  I have reviewed this information and discussed as appropriate with the patient.  See HPI as well for other ROS.   ROS    Medical History: Past Medical History      Past Medical History:  Diagnosis Date   History of cancer             Patient Active Problem List  Diagnosis   Malignant neoplasm of upper-outer quadrant of left breast in female, estrogen receptor negative (CMS-HCC)      Past Surgical History  No past surgical history on file.      Allergies  No Known Allergies           Current Outpatient Medications on File Prior to Visit  Medication Sig Dispense Refill   escitalopram oxalate (LEXAPRO) 10 MG tablet         hydroCHLOROthiazide (HYDRODIURIL) 25 MG tablet Take 1 tablet (25 mg total) by mouth once daily       hydrOXYzine (ATARAX) 25 MG tablet TAKE 1 AND 1/2 TABLETS BY MOUTH EVERY NIGHT FOR SLEEP       pravastatin (PRAVACHOL) 10 MG tablet           No current facility-administered medications on file prior to visit.      Family History  No family history on file.      Social History       Tobacco Use  Smoking Status Unknown  Smokeless Tobacco Not on file      Social History  Social History        Socioeconomic History   Marital status: Unknown  Tobacco Use   Smoking status: Unknown  Vaping Use   Vaping Use: Unknown  Substance and Sexual Activity   Alcohol use: Defer   Drug use: Defer        Objective:       Vitals:    01/21/21 1304  BP: 139/76  Pulse: 68  Resp: 17  Temp: 97.8 F (36.6 C)  SpO2: 100%      Physical Exam    She appears well on exam.   There are no palpable breast masses.  There is ecchymosis in the left breast from the biopsy.  The nipple areolar complex is normal.  There is no lymphadenopathy in the axilla.  Lungs clear CV RRR Abdomen soft, NT Neuro grossly intact   Labs, Imaging  and Diagnostic Testing: I have reviewed her mammograms, ultrasound, and pathology results.   Assessment and Plan:  Diagnoses and all orders for this visit:   Invasive ductal carcinoma of breast, left (CMS-HCC)       I reviewed her notes in the electronic medical records.  We have discussed her in our multidisciplinary breast cancer conference as well.  She has both invasive and in situ ductal carcinoma in the left breast.  Again the total size of the area is 2.5 cm and the invasive cancer is 0.4 cm.  I discussed the diagnosis with the patient and her sister.  We next discussed surgery for breast cancer.  We discussed with breast conservation and mastectomies.  I think she may be a candidate for breast conservation and she is interested in this.  I next proceeded with discussing a radioactive seed guided left breast lumpectomy and left axillary sentinel lymph node biopsy to remove both the cancer and in situ disease.  I explained the surgical procedure in detail.  We discussed the risks.  These include  but are not limited to bleeding, infection, need for further surgery if margins or lymph nodes are positive, injury to surrounding structures, seroma formation, gland swelling, cardiopulmonary issues, postoperative recovery, etc. They understand and she wished to proceed with surgery which will be scheduled.`

## 2021-02-09 ENCOUNTER — Ambulatory Visit (HOSPITAL_COMMUNITY): Payer: Managed Care, Other (non HMO) | Admitting: Physician Assistant

## 2021-02-09 ENCOUNTER — Other Ambulatory Visit: Payer: Self-pay

## 2021-02-09 ENCOUNTER — Encounter (HOSPITAL_COMMUNITY): Payer: Self-pay | Admitting: Surgery

## 2021-02-09 ENCOUNTER — Ambulatory Visit (HOSPITAL_COMMUNITY): Payer: Managed Care, Other (non HMO) | Admitting: Anesthesiology

## 2021-02-09 ENCOUNTER — Ambulatory Visit (HOSPITAL_COMMUNITY)
Admission: RE | Admit: 2021-02-09 | Discharge: 2021-02-09 | Disposition: A | Discharge status: Home / Self Care | Payer: Managed Care, Other (non HMO) | Attending: Surgery | Admitting: Surgery

## 2021-02-09 ENCOUNTER — Encounter (HOSPITAL_COMMUNITY)
Admission: RE | Disposition: A | Discharge status: Home / Self Care | Payer: Self-pay | Source: Home / Self Care | Attending: Surgery

## 2021-02-09 DIAGNOSIS — Z171 Estrogen receptor negative status [ER-]: Secondary | ICD-10-CM | POA: Diagnosis not present

## 2021-02-09 DIAGNOSIS — C50412 Malignant neoplasm of upper-outer quadrant of left female breast: Secondary | ICD-10-CM | POA: Diagnosis present

## 2021-02-09 DIAGNOSIS — C50812 Malignant neoplasm of overlapping sites of left female breast: Secondary | ICD-10-CM | POA: Insufficient documentation

## 2021-02-09 DIAGNOSIS — N6032 Fibrosclerosis of left breast: Secondary | ICD-10-CM | POA: Diagnosis not present

## 2021-02-09 HISTORY — PX: SENTINEL NODE BIOPSY: SHX6608

## 2021-02-09 HISTORY — PX: BREAST LUMPECTOMY WITH RADIOACTIVE SEED LOCALIZATION: SHX6424

## 2021-02-09 SURGERY — BREAST LUMPECTOMY WITH RADIOACTIVE SEED LOCALIZATION
Anesthesia: General | Site: Breast | Laterality: Left

## 2021-02-09 MED ORDER — FENTANYL CITRATE (PF) 250 MCG/5ML IJ SOLN
INTRAMUSCULAR | Status: DC | PRN
  Administered 2021-02-09: 50 ug via INTRAVENOUS

## 2021-02-09 MED ORDER — CHLORHEXIDINE GLUCONATE CLOTH 2 % EX PADS: 6.0000 | MEDICATED_PAD | Freq: Once | CUTANEOUS | Status: DC

## 2021-02-09 MED ORDER — DEXAMETHASONE SODIUM PHOSPHATE 10 MG/ML IJ SOLN
INTRAMUSCULAR | Status: AC
  Filled 2021-02-09: qty 1

## 2021-02-09 MED ORDER — PROPOFOL 10 MG/ML IV BOLUS
INTRAVENOUS | Status: DC | PRN
  Administered 2021-02-09: 130 mg via INTRAVENOUS

## 2021-02-09 MED ORDER — LACTATED RINGERS IV SOLN: INTRAVENOUS | Status: DC

## 2021-02-09 MED ORDER — BUPIVACAINE-EPINEPHRINE (PF) 0.25% -1:200000 IJ SOLN
INTRAMUSCULAR | Status: AC
  Filled 2021-02-09: qty 30

## 2021-02-09 MED ORDER — ONDANSETRON HCL 4 MG/2ML IJ SOLN
INTRAMUSCULAR | Status: AC
  Filled 2021-02-09: qty 2

## 2021-02-09 MED ORDER — FENTANYL CITRATE (PF) 250 MCG/5ML IJ SOLN
INTRAMUSCULAR | Status: AC
  Filled 2021-02-09: qty 5

## 2021-02-09 MED ORDER — FENTANYL CITRATE (PF) 100 MCG/2ML IJ SOLN
INTRAMUSCULAR | Status: AC
  Administered 2021-02-09: 13:00:00 50 ug via INTRAVENOUS
  Filled 2021-02-09: qty 2

## 2021-02-09 MED ORDER — OXYCODONE HCL 5 MG PO TABS: 5.0000 mg | ORAL_TABLET | Freq: Once | ORAL | Status: DC | PRN

## 2021-02-09 MED ORDER — 0.9 % SODIUM CHLORIDE (POUR BTL) OPTIME
TOPICAL | Status: DC | PRN
  Administered 2021-02-09: 14:00:00 1000 mL

## 2021-02-09 MED ORDER — OXYCODONE HCL 5 MG/5ML PO SOLN: 5.0000 mg | Freq: Once | ORAL | Status: DC | PRN

## 2021-02-09 MED ORDER — MAGTRACE LYMPHATIC TRACER
INTRAMUSCULAR | Status: DC | PRN
  Administered 2021-02-09: 13:00:00 2 mL via INTRAMUSCULAR

## 2021-02-09 MED ORDER — EPHEDRINE SULFATE-NACL 50-0.9 MG/10ML-% IV SOSY
PREFILLED_SYRINGE | INTRAVENOUS | Status: DC | PRN
  Administered 2021-02-09 (×4): 5 mg via INTRAVENOUS
  Administered 2021-02-09: 10 mg via INTRAVENOUS

## 2021-02-09 MED ORDER — FENTANYL CITRATE (PF) 100 MCG/2ML IJ SOLN: 50.0000 ug | Freq: Once | INTRAMUSCULAR | Status: AC

## 2021-02-09 MED ORDER — DEXAMETHASONE SODIUM PHOSPHATE 10 MG/ML IJ SOLN
INTRAMUSCULAR | Status: DC | PRN
  Administered 2021-02-09: 4 mg via INTRAVENOUS

## 2021-02-09 MED ORDER — CEFAZOLIN SODIUM-DEXTROSE 2-4 GM/100ML-% IV SOLN
2.0000 g | INTRAVENOUS | Status: AC
  Administered 2021-02-09: 14:00:00 2 g via INTRAVENOUS

## 2021-02-09 MED ORDER — LIDOCAINE 2% (20 MG/ML) 5 ML SYRINGE
INTRAMUSCULAR | Status: AC
  Filled 2021-02-09: qty 5

## 2021-02-09 MED ORDER — ORAL CARE MOUTH RINSE: 15.0000 mL | Freq: Once | OROMUCOSAL | Status: AC

## 2021-02-09 MED ORDER — ACETAMINOPHEN 500 MG PO TABS
ORAL_TABLET | ORAL | Status: AC
  Administered 2021-02-09: 13:00:00 1000 mg via ORAL
  Filled 2021-02-09: qty 2

## 2021-02-09 MED ORDER — METHYLENE BLUE 0.5 % INJ SOLN
INTRAVENOUS | Status: AC
  Filled 2021-02-09: qty 10

## 2021-02-09 MED ORDER — ROPIVACAINE HCL 5 MG/ML IJ SOLN
INTRAMUSCULAR | Status: DC | PRN
  Administered 2021-02-09: 25 mL via PERINEURAL

## 2021-02-09 MED ORDER — BUPIVACAINE-EPINEPHRINE 0.25% -1:200000 IJ SOLN
INTRAMUSCULAR | Status: DC | PRN
  Administered 2021-02-09: 25 mL

## 2021-02-09 MED ORDER — CEFAZOLIN SODIUM-DEXTROSE 2-4 GM/100ML-% IV SOLN
INTRAVENOUS | Status: AC
  Filled 2021-02-09: qty 100

## 2021-02-09 MED ORDER — CHLORHEXIDINE GLUCONATE 0.12 % MT SOLN: 15.0000 mL | Freq: Once | OROMUCOSAL | Status: AC

## 2021-02-09 MED ORDER — ONDANSETRON HCL 4 MG/2ML IJ SOLN
INTRAMUSCULAR | Status: DC | PRN
  Administered 2021-02-09: 4 mg via INTRAVENOUS

## 2021-02-09 MED ORDER — MIDAZOLAM HCL 2 MG/2ML IJ SOLN: 1.0000 mg | Freq: Once | INTRAMUSCULAR | Status: AC

## 2021-02-09 MED ORDER — ACETAMINOPHEN 500 MG PO TABS: 1000.0000 mg | ORAL_TABLET | ORAL | Status: AC

## 2021-02-09 MED ORDER — SODIUM CHLORIDE (PF) 0.9 % IJ SOLN
INTRAMUSCULAR | Status: AC
  Filled 2021-02-09: qty 10

## 2021-02-09 MED ORDER — MIDAZOLAM HCL 2 MG/2ML IJ SOLN
INTRAMUSCULAR | Status: AC
  Administered 2021-02-09: 13:00:00 1 mg via INTRAVENOUS
  Filled 2021-02-09: qty 2

## 2021-02-09 MED ORDER — CHLORHEXIDINE GLUCONATE 0.12 % MT SOLN
OROMUCOSAL | Status: AC
  Administered 2021-02-09: 13:00:00 15 mL via OROMUCOSAL
  Filled 2021-02-09: qty 15

## 2021-02-09 MED ORDER — FENTANYL CITRATE (PF) 100 MCG/2ML IJ SOLN: 25.0000 ug | INTRAMUSCULAR | Status: DC | PRN

## 2021-02-09 MED ORDER — EPHEDRINE 5 MG/ML INJ
INTRAVENOUS | Status: AC
  Filled 2021-02-09: qty 5

## 2021-02-09 MED ORDER — ONDANSETRON HCL 4 MG/2ML IJ SOLN: 4.0000 mg | Freq: Four times a day (QID) | INTRAMUSCULAR | Status: DC | PRN

## 2021-02-09 MED ORDER — TRAMADOL HCL 50 MG PO TABS: 50.0000 mg | ORAL_TABLET | Freq: Four times a day (QID) | ORAL | 0 refills | Status: DC | PRN

## 2021-02-09 MED ORDER — LIDOCAINE 2% (20 MG/ML) 5 ML SYRINGE
INTRAMUSCULAR | Status: DC | PRN
  Administered 2021-02-09: 60 mg via INTRAVENOUS

## 2021-02-09 MED ORDER — PROPOFOL 10 MG/ML IV BOLUS
INTRAVENOUS | Status: AC
  Filled 2021-02-09: qty 20

## 2021-02-09 MED ORDER — ENSURE PRE-SURGERY PO LIQD: 296.0000 mL | Freq: Once | ORAL | Status: DC

## 2021-02-09 SURGICAL SUPPLY — 30 items
APPLIER CLIP 9.375 MED OPEN (MISCELLANEOUS) ×3
BAG COUNTER SPONGE SURGICOUNT (BAG) ×3 IMPLANT
CANISTER SUCT 3000ML PPV (MISCELLANEOUS) ×3 IMPLANT
CHLORAPREP W/TINT 26 (MISCELLANEOUS) ×3 IMPLANT
CLIP APPLIE 9.375 MED OPEN (MISCELLANEOUS) ×2 IMPLANT
COVER PROBE W GEL 5X96 (DRAPES) ×6 IMPLANT
COVER SURGICAL LIGHT HANDLE (MISCELLANEOUS) ×3 IMPLANT
DERMABOND ADVANCED (GAUZE/BANDAGES/DRESSINGS) ×1
DERMABOND ADVANCED .7 DNX12 (GAUZE/BANDAGES/DRESSINGS) ×2 IMPLANT
DEVICE DUBIN SPECIMEN MAMMOGRA (MISCELLANEOUS) ×3 IMPLANT
DRAPE CHEST BREAST 15X10 FENES (DRAPES) ×3 IMPLANT
ELECT CAUTERY BLADE 6.4 (BLADE) ×3 IMPLANT
ELECT REM PT RETURN 9FT ADLT (ELECTROSURGICAL) ×3
ELECTRODE REM PT RTRN 9FT ADLT (ELECTROSURGICAL) ×2 IMPLANT
GLOVE SURG SIGNA 7.5 PF LTX (GLOVE) IMPLANT
GOWN STRL REUS W/ TWL LRG LVL3 (GOWN DISPOSABLE) ×2 IMPLANT
GOWN STRL REUS W/ TWL XL LVL3 (GOWN DISPOSABLE) ×2 IMPLANT
GOWN STRL REUS W/TWL LRG LVL3 (GOWN DISPOSABLE) ×1
GOWN STRL REUS W/TWL XL LVL3 (GOWN DISPOSABLE) ×1
KIT BASIN OR (CUSTOM PROCEDURE TRAY) ×3 IMPLANT
KIT MARKER MARGIN INK (KITS) ×3 IMPLANT
NEEDLE HYPO 25GX1X1/2 BEV (NEEDLE) ×3 IMPLANT
NS IRRIG 1000ML POUR BTL (IV SOLUTION) ×3 IMPLANT
PACK GENERAL/GYN (CUSTOM PROCEDURE TRAY) ×3 IMPLANT
SUT MNCRL AB 4-0 PS2 18 (SUTURE) ×3 IMPLANT
SUT VIC AB 3-0 SH 18 (SUTURE) ×3 IMPLANT
SYR CONTROL 10ML LL (SYRINGE) ×3 IMPLANT
TOWEL GREEN STERILE (TOWEL DISPOSABLE) ×3 IMPLANT
TOWEL GREEN STERILE FF (TOWEL DISPOSABLE) ×3 IMPLANT
TRACER MAGTRACE VIAL (MISCELLANEOUS) ×3 IMPLANT

## 2021-02-09 NOTE — Op Note (Signed)
Leah Chan 02/09/2021   Pre-op Diagnosis: LEFT BREAST CANCER     Post-op Diagnosis: same  Procedure(s): LEFT BREAST LUMPECTOMY WITH RADIOACTIVE SEED LOCALIZATION (2 SEEDS) LEFT DEEP AXILLARY SENTINEL LYMPH NODE BIOPSY  Surgeon(s): [name], MD  Anesthesia: General  Staff:  Circulator: Celene Squibb, RN Scrub Person: Martin Majestic  Estimated Blood Loss: Minimal               Specimens: SENT TO PATH  Indications: This is a 60 year old female who was found on screen mammography to have a small mass as well as calcifications in the left breast.  The mass measured approximately 4 mm in size in the area of calcifications was 1.8 cm.  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  Biopsy of the calcifications showed DCIS.  The total size of the area was approximate 2.5 cm.  The decision was made after discussion with the patient to proceed with a radioactive seed guided bracketed lumpectomy and sentinel node biopsy  Findings: The anteriormost radioactive seed displaced from the lumpectomy site just as soon as dissection was started.  I completed the lumpectomy staying around the second seed.  X-ray of this did not show the original biopsy clip from the more superficial biopsy.  I took more medial margin and lateral margin and superior margin and finally found the original clip and the final tissue which was labeled as anterior margin  Procedure: The patient was identified the preoperative holding area.  Her left breast was prepped.  It was anesthetized with lidocaine and then mag trace was injected underneath the nipple areolar complex of the left breast.  Anesthesia performed a pectoralis nerve block under ultrasound guidance.  She was then taken to the operating room.  She is placed upon the operating table general anesthesia was induced.  I massaged the breast for another minute.  The left breast was then prepped and draped in usual sterile fashion  including the axilla.  Using neoprobe identified the radioactive seeds just underneath the nipple areolar complex.  I anesthetized skin with Marcaine and made a circular incision with a scalpel.  I then started dissecting down to the breast tissue and immediately identified a radioactive seed that then came out of the lumpectomy specimen.  This was removed and placed in a cup and x-rayed.  With the neoprobe I could identify the deeper seed as well.  I stayed widely around the superficial tissue and dissected down deep past the second seed with the cautery.  Once the lumpectomy was completed I marked all margins with paint and x-rayed the specimen.  This showed the more posterior biopsy clip and seed but did not show the more anterior clip.  I took superior lateral margin as well as medial margins which were painted and x-rayed and could still not identify clip.  I then took more anterior and superior margin at the 12:00 and 1 o'clock position of the breast and this then showed the more anterior biopsy clip.  All the tissue was sent to pathology for evaluation. With the mag trace probe I was then able to identify an area of increased uptake in the left axilla.  I anesthetized the skin with Marcaine and made incision with a scalpel and then dissected into the deep axillary tissue.  I then identified a moderate sized sentinel lymph node that had extensive uptake of the mag trace.  I sent this with the surrounding tissue and 1 specimen to pathology for evaluation.  I palpated the axilla and found no other lymph nodes and there was no other increased uptake of the mag trace with the probe.  I achieved hemostasis in the axilla with surgical clips and the cautery.  I injected more Marcaine into the lumpectomy cavity of the breast.  I placed clips around the periphery of the lumpectomy cavity.  I then closed the subtenons tissue of both incisions with interrupted 3-0 Vicryl sutures and closed the skin of both incisions  with running 4-0 Monocryl sutures.  Dermabond was then applied.  The patient tolerated the procedure well.  All the counts were correct at the end of the procedure.  The patient was then extubated in the operating room and taken in a stable condition to the recovery room.          [name]   Date: 02/09/2021  Time: 3:01 PM

## 2021-02-09 NOTE — Anesthesia Procedure Notes (Signed)
Procedure Name: LMA Insertion Date/Time: 02/09/2021 1:55 PM Performed by: Trinna Post., CRNA Pre-anesthesia Checklist: Patient identified, Emergency Drugs available, Suction available, Patient being monitored and Timeout performed Patient Re-evaluated:Patient Re-evaluated prior to induction Oxygen Delivery Method: Circle system utilized Preoxygenation: Pre-oxygenation with 100% oxygen Induction Type: IV induction Ventilation: Mask ventilation without difficulty LMA: LMA inserted LMA Size: 4.0 Number of attempts: 1 Placement Confirmation: positive ETCO2 and breath sounds checked- equal and bilateral Tube secured with: Tape Dental Injury: Teeth and Oropharynx as per pre-operative assessment

## 2021-02-09 NOTE — Interval H&P Note (Signed)
History and Physical Interval Note:no change in H and P  02/09/2021 1:07 PM  Leah Chan  has presented today for surgery, with the diagnosis of LEFT BREAST CANCER.  The various methods of treatment have been discussed with the patient and family. After consideration of risks, benefits and other options for treatment, the patient has consented to  Procedure(s): LEFT BREAST LUMPECTOMY WITH RADIOACTIVE SEED LOCALIZATION (Left) SENTINEL NODE BIOPSY (Left) as a surgical intervention.  The patient's history has been reviewed, patient examined, no change in status, stable for surgery.  I have reviewed the patient's chart and labs.  Questions were answered to the patient's satisfaction.     [name]

## 2021-02-09 NOTE — Discharge Instructions (Signed)
[facility] Office Phone Number [phone]  BREAST BIOPSY/ PARTIAL MASTECTOMY: POST OP INSTRUCTIONS  Always review your discharge instruction sheet given to you by the facility where your surgery was performed.  IF YOU HAVE DISABILITY OR FAMILY LEAVE FORMS, YOU MUST BRING THEM TO THE OFFICE FOR PROCESSING.  DO NOT GIVE THEM TO YOUR DOCTOR.  A prescription for pain medication may be given to you upon discharge.  Take your pain medication as prescribed, if needed.  If narcotic pain medicine is not needed, then you may take acetaminophen (Tylenol) or ibuprofen (Advil) as needed. Take your usually prescribed medications unless otherwise directed If you need a refill on your pain medication, please contact your pharmacy.  They will contact our office to request authorization.  Prescriptions will not be filled after 5pm or on week-ends. You should eat very light the first 24 hours after surgery, such as soup, crackers, pudding, etc.  Resume your normal diet the day after surgery. Most patients will experience some swelling and bruising in the breast.  Ice packs and a good support bra will help.  Swelling and bruising can take several days to resolve.  It is common to experience some constipation if taking pain medication after surgery.  Increasing fluid intake and taking a stool softener will usually help or prevent this problem from occurring.  A mild laxative (Milk of Magnesia or Miralax) should be taken according to package directions if there are no bowel movements after 48 hours. Unless discharge instructions indicate otherwise, you may remove your bandages 24-48 hours after surgery, and you may shower at that time.  You may have steri-strips (small skin tapes) in place directly over the incision.  These strips should be left on the skin for 7-10 days.  If your surgeon used skin glue on the incision, you may shower in 24 hours.  The glue will flake off over the next 2-3 weeks.  Any  sutures or staples will be removed at the office during your follow-up visit. ACTIVITIES:  You may resume regular daily activities (gradually increasing) beginning the next day.  Wearing a good support bra or sports bra minimizes pain and swelling.  You may have sexual intercourse when it is comfortable. You may drive when you no longer are taking prescription pain medication, you can comfortably wear a seatbelt, and you can safely maneuver your car and apply brakes. RETURN TO WORK:  ______________________________________________________________________________________ You should see your doctor in the office for a follow-up appointment approximately two weeks after your surgery.  Your doctor's nurse will typically make your follow-up appointment when she calls you with your pathology report.  Expect your pathology report 2-3 business days after your surgery.  You may call to check if you do not hear from us after three days. OTHER INSTRUCTIONS: OK TO SHOWER STARTING TOMORROW ICE PACK, TYLENOL, AND IBUPROFEN ALSO FOR PAIN NO VIGOROUS ACTIVITY FOR ONE WEEK _______________________________________________________________________________________________ _____________________________________________________________________________________________________________________________________ _____________________________________________________________________________________________________________________________________ _____________________________________________________________________________________________________________________________________  WHEN TO CALL YOUR DOCTOR: Fever over 101.0 Nausea and/or vomiting. Extreme swelling or bruising. Continued bleeding from incision. Increased pain, redness, or drainage from the incision.  The clinic staff is available to answer your questions during regular business hours.  Please don't hesitate to call and ask to speak to one of the nurses for clinical  concerns.  If you have a medical emergency, go to the nearest emergency room or call 911.  A surgeon from [facility] is always on call at the hospital.  For further questions, please visit   [email or web address]   

## 2021-02-09 NOTE — Anesthesia Procedure Notes (Signed)
Anesthesia Regional Block: Pectoralis block   Pre-Anesthetic Checklist: , timeout performed,  Correct Patient, Correct Site, Correct Laterality,  Correct Procedure, Correct Position, site marked,  Risks and benefits discussed,  Surgical consent,  Pre-op evaluation,  At surgeon's request and post-op pain management  Laterality: Left  Prep: chloraprep       Needles:  Injection technique: Single-shot  Needle Type: Echogenic Needle     Needle Length: 9cm  Needle Gauge: 21     Additional Needles:   Narrative:  Start time: 02/09/2021 1:16 PM End time: 02/09/2021 1:25 PM Injection made incrementally with aspirations every 5 mL.  Performed by: Personally  Anesthesiologist: Albertha Ghee, MD  Additional Notes: Pt tolerated the procedure well.

## 2021-02-09 NOTE — Anesthesia Preprocedure Evaluation (Signed)
Anesthesia Evaluation  Patient identified by MRN, date of birth, ID band Patient awake    Reviewed: Allergy & Precautions, H&P , NPO status , Patient's Chart, lab work & pertinent test results  Airway Mallampati: II   Neck ROM: full    Dental   Pulmonary asthma ,    breath sounds clear to auscultation       Cardiovascular hypertension,  Rhythm:regular Rate:Normal     Neuro/Psych PSYCHIATRIC DISORDERS Anxiety    GI/Hepatic   Endo/Other    Renal/GU      Musculoskeletal   Abdominal   Peds  Hematology   Anesthesia Other Findings   Reproductive/Obstetrics                             Anesthesia Physical Anesthesia Plan  ASA: 3  Anesthesia Plan: General   Post-op Pain Management: Regional block   Induction: Intravenous  PONV Risk Score and Plan: 3 and Ondansetron, Dexamethasone, Midazolam and Treatment may vary due to age or medical condition  Airway Management Planned: LMA  Additional Equipment:   Intra-op Plan:   Post-operative Plan: Extubation in OR  Informed Consent: I have reviewed the patients History and Physical, chart, labs and discussed the procedure including the risks, benefits and alternatives for the proposed anesthesia with the patient or authorized representative who has indicated his/her understanding and acceptance.     Dental advisory given  Plan Discussed with: CRNA, Anesthesiologist and Surgeon  Anesthesia Plan Comments:         Anesthesia Quick Evaluation

## 2021-02-09 NOTE — Transfer of Care (Signed)
Immediate Anesthesia Transfer of Care Note  Patient: Leah Chan  Procedure(s) Performed: LEFT BREAST LUMPECTOMY WITH RADIOACTIVE SEED LOCALIZATION (2 SEEDS) (Left: Breast) LEFT AXILLARY SENTINEL LYMPH NODE BIOPSY (Left: Axilla)  Patient Location: PACU  Anesthesia Type:General  Level of Consciousness: awake, alert , oriented and patient cooperative  Airway & Oxygen Therapy: Patient Spontanous Breathing and Patient connected to nasal cannula oxygen  Post-op Assessment: Report given to RN and Post -op Vital signs reviewed and stable  Post vital signs: Reviewed and stable  Last Vitals:  Vitals Value Taken Time  BP 155/69 02/09/21 1507  Temp 36.4 C 02/09/21 1507  Pulse 70 02/09/21 1511  Resp 15 02/09/21 1511  SpO2 96 % 02/09/21 1511  Vitals shown include unvalidated device data.  Last Pain:  Vitals:   02/09/21 1307  TempSrc:   PainSc: 0-No pain         Complications: No notable events documented.

## 2021-02-10 ENCOUNTER — Encounter (HOSPITAL_COMMUNITY): Payer: Self-pay | Admitting: Surgery

## 2021-02-10 NOTE — Anesthesia Postprocedure Evaluation (Signed)
Anesthesia Post Note  Patient: Leah Chan  Procedure(s) Performed: LEFT BREAST LUMPECTOMY WITH RADIOACTIVE SEED LOCALIZATION (2 SEEDS) (Left: Breast) LEFT AXILLARY SENTINEL LYMPH NODE BIOPSY (Left: Axilla)     Patient location during evaluation: PACU Anesthesia Type: General Level of consciousness: awake and alert Pain management: pain level controlled Vital Signs Assessment: post-procedure vital signs reviewed and stable Respiratory status: spontaneous breathing, nonlabored ventilation, respiratory function stable and patient connected to nasal cannula oxygen Cardiovascular status: blood pressure returned to baseline and stable Postop Assessment: no apparent nausea or vomiting Anesthetic complications: no   No notable events documented.  Last Vitals:  Vitals:   02/09/21 1523 02/09/21 1537  BP: (!) 137/56 (!) 132/58  Pulse: 68 70  Resp: 16 16  Temp:  (!) 36.4 C  SpO2: 96% 96%    Last Pain:  Vitals:   02/09/21 1537  TempSrc:   PainSc: 0-No pain   Pain Goal:                   Lu Paradise S

## 2021-02-12 ENCOUNTER — Ambulatory Visit: Payer: Self-pay | Admitting: Genetic Counselor

## 2021-02-12 ENCOUNTER — Telehealth: Payer: Self-pay | Admitting: Genetic Counselor

## 2021-02-12 DIAGNOSIS — Z8042 Family history of malignant neoplasm of prostate: Secondary | ICD-10-CM

## 2021-02-12 DIAGNOSIS — C50412 Malignant neoplasm of upper-outer quadrant of left female breast: Secondary | ICD-10-CM

## 2021-02-12 DIAGNOSIS — Z808 Family history of malignant neoplasm of other organs or systems: Secondary | ICD-10-CM

## 2021-02-12 DIAGNOSIS — Z1379 Encounter for other screening for genetic and chromosomal anomalies: Secondary | ICD-10-CM

## 2021-02-12 NOTE — Telephone Encounter (Signed)
Revealed negative pan-cancer panel results.

## 2021-02-12 NOTE — Progress Notes (Signed)
HPI:   Leah Chan was previously seen in the [place] [facility] due to a personal and family history of cancer and concerns regarding a hereditary predisposition to cancer. Please refer to our prior cancer genetics clinic note for more information regarding our discussion, assessment and recommendations, at the time. Leah Chan recent genetic test results were disclosed to her, as were recommendations warranted by these results. These results and recommendations are discussed in more detail below.  CANCER HISTORY:  Oncology History  Malignant neoplasm of upper-outer quadrant of left breast in female, estrogen receptor negative ([place])  01/20/2021 Initial Diagnosis   Screening mammogram detected 2 asymmetric areas with calcifications detected in March 2022.  October 2022 mammogram revealed left breast calcifications 1.8 cm at 1 o'clock position and a mass below the skin measuring 4 mm.  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 The report dates are January 29, 2021 and February 09, 2021.   The BRCAplus panel offered by [employer] and includes sequencing and deletion/duplication analysis for the following 8 genes: ATM, BRCA1, BRCA2, CDH1, CHEK2, PALB2, PTEN, and TP53.  The CustomNext-Cancer+RNAinsight panel offered by [facility] includes sequencing and rearrangement analysis for the following 52 genes:  APC, ATM, AXIN2, BAP1, BARD1, BMPR1A, BRCA1, BRCA2, BRIP1, CDH1, CDK4, CDKN2A, CHEK2CTNNA1, DICER1, MEN1, MLH1, MSH2, MSH3, MSH6, MUTYH, NBN, NF1, NTHL1, PALB2, PMS2, POT1, PTCH1, PTEN, RAD50, RAD51C, RAD51D, SDHA, SDHB, SDHC, SDHD, SMAD4, SMARCA4, STK11, SUFU, TP53, TSC1, TSC2 and VHL (sequencing and deletion/duplication); HOXB13,  KIT, MITF, PDGFRA, POLD1 and POLE (sequencing only); EPCAM and GREM1 (deletion/duplication only). RNA data is routinely analyzed for use in variant interpretation for all genes.     FAMILY HISTORY:  We obtained a detailed, 4-generation family history.  Significant diagnoses are listed below: Family History  Problem Relation Age of Onset   Prostate cancer Father        mets; d. 65   Prostate cancer Maternal Uncle        dx after 21     Leah Chan is unaware of previous family history of genetic testing for hereditary cancer risks. There is no reported Ashkenazi Jewish ancestry. There is no known consanguinity.  GENETIC TEST RESULTS:  The [facility] CustomNext-Cancer +RNAinsight Panel found no pathogenic mutations. The CustomNext-Cancer+RNAinsight panel offered by [facility] includes sequencing and rearrangement analysis for the following 52 genes:  APC, ATM, AXIN2, BAP1, BARD1, BMPR1A, BRCA1, BRCA2, BRIP1, CDH1, CDK4, CDKN2A, CHEK2CTNNA1, DICER1, MEN1, MLH1, MSH2, MSH3, MSH6, MUTYH, NBN, NF1, NTHL1, PALB2, PMS2, POT1, PTCH1, PTEN, RAD50, RAD51C, RAD51D, SDHA, SDHB, SDHC, SDHD, SMAD4, SMARCA4, STK11, SUFU, TP53, TSC1, TSC2 and VHL (sequencing and deletion/duplication); HOXB13, KIT, MITF, PDGFRA, POLD1 and POLE (sequencing only); EPCAM and GREM1 (deletion/duplication only). RNA data is routinely analyzed for use in variant interpretation for all genes.  The test report has been scanned into EPIC and is located under the Molecular Pathology section of the Results Review tab.  A portion of the result report is included below for reference. Genetic testing reported out on February 09, 2021.      Even though a pathogenic variant was not identified, possible explanations for the cancer in the family may include: There may be no hereditary risk for cancer in the family. The cancers in Leah Chan and/or her family may  be sporadic/familial or due to other genetic and environmental factors. There may  be a gene mutation in one of these genes that current testing methods cannot detect but that chance is small. There could be another gene that has not yet been discovered, or that we have not yet tested, that is responsible for the cancer diagnoses in the family.  It is also possible there is a hereditary cause for the cancer in the family that Leah Chan did not inherit.  Therefore, it is important to remain in touch with cancer genetics in the future so that we can continue to offer Leah Chan the most up to date genetic testing.    ADDITIONAL GENETIC TESTING:   We discussed with Leah Chan that her genetic testing was fairly extensive.  If there are other relevant genes identified to increase cancer risk that can be analyzed in the future, we would be happy to discuss and coordinate this testing at that time.    CANCER SCREENING RECOMMENDATIONS:  Leah Chan test result is considered negative (normal).  This means that we have not identified a hereditary cause for her personal history of cancer at this time.   An individual's cancer risk and medical management are not determined by genetic test results alone. Overall cancer risk assessment incorporates additional factors, including personal medical history, family history, and any available genetic information that may result in a personalized plan for cancer prevention and surveillance. Therefore, it is recommended she continue to follow the cancer management and screening guidelines provided by her oncology and primary healthcare provider.  RECOMMENDATIONS FOR FAMILY MEMBERS:   Since she did not inherit a identifiable mutation in a cancer predisposition gene included on this panel, her children could not have inherited a known mutation from her in one of these genes. Individuals in this family might be at some increased risk of developing cancer, over the general population risk, due to the family history of cancer.  Individuals in the  family should notify their providers of the family history of cancer. We recommend women in this family have a yearly mammogram beginning at age 82, or 43 years younger than the earliest onset of cancer, an annual clinical breast exam, and perform monthly breast self-exams.  Family members should have colonoscopies by at age 20, or earlier, as recommended by their providers.  Males in the family should speak with their providers about considering prostate cancer screening at an appropriate age.   FOLLOW-UP:  Lastly, we discussed with Ms. Theisen that cancer genetics is a rapidly advancing field and it is possible that new genetic tests will be appropriate for her and/or her family members in the future. We encouraged her to remain in contact with cancer genetics on an annual basis so we can update her personal and family histories and let her know of advances in cancer genetics that may benefit this family.   Our contact number was provided. Ms. Utt questions were answered to her satisfaction, and she knows she is welcome to call us at anytime with additional questions or concerns.   Leah Chan M. Joette Catching, [place], [facility] Genetic Counselor [email or web address] (P) [phone]

## 2021-02-17 ENCOUNTER — Encounter: Payer: Self-pay | Admitting: *Deleted

## 2021-02-17 LAB — SURGICAL PATHOLOGY

## 2021-02-18 ENCOUNTER — Encounter: Payer: Self-pay | Admitting: *Deleted

## 2021-02-18 NOTE — Progress Notes (Signed)
Received fax from [facility] with [employer] providing contact number [phone] ext [phone] and fax [phone] if any needs or concerns arise. Fax sent to HIM to be scanned into pt chart.

## 2021-02-24 NOTE — Assessment & Plan Note (Signed)
Screening mammogram detected 2 asymmetric areas with calcifications detected in March 2022.  October 2022 mammogram revealed left breast calcifications 1.8 cm at 1 o'clock position and a mass below the skin measuring 4 mm.  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  Adjuvant chemo 2. adjuvant radiation  Genetics: Neg

## 2021-02-24 NOTE — Progress Notes (Signed)
Patient Care Team: Pcp, No as PCP - General [name], MD as Consulting Physician (General Surgery) [name], MD as Consulting Physician (Hematology and Oncology) Kyung Rudd, MD as Consulting Physician (Radiation Oncology)  DIAGNOSIS:    ICD-10-CM   1. Malignant neoplasm of upper-outer quadrant of left breast in female, estrogen receptor negative ([name])  C50.412    Z17.1       SUMMARY OF ONCOLOGIC HISTORY: Oncology History  Malignant neoplasm of upper-outer quadrant of left breast in female, estrogen receptor negative (Leah Chan)  01/20/2021 Initial Diagnosis   Screening mammogram detected 2 asymmetric areas with calcifications detected in March 2022.  October 2022 mammogram revealed left breast calcifications 1.8 cm at 1 o'clock position and a mass below the skin measuring 4 mm.  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 The report dates are January 29, 2021 and February 09, 2021.   The BRCAplus panel offered by [employer] and includes sequencing and deletion/duplication analysis for the following 8 genes: ATM, BRCA1, BRCA2, CDH1, CHEK2, PALB2, PTEN, and TP53.  The CustomNext-Cancer+RNAinsight panel offered by [employer] includes sequencing and rearrangement analysis for the following 52 genes:  APC, ATM, AXIN2, BAP1, BARD1, BMPR1A, BRCA1, BRCA2, BRIP1, CDH1, CDK4, CDKN2A, CHEK2CTNNA1, DICER1, MEN1, MLH1, MSH2, MSH3, MSH6, MUTYH, NBN, NF1, NTHL1, PALB2, PMS2, POT1, PTCH1, PTEN, RAD50, RAD51C, RAD51D, SDHA, SDHB, SDHC, SDHD, SMAD4, SMARCA4, STK11, SUFU, TP53, TSC1, TSC2 and VHL (sequencing and deletion/duplication); HOXB13, KIT, MITF, PDGFRA, POLD1 and POLE (sequencing only); EPCAM and GREM1 (deletion/duplication only). RNA  data is routinely analyzed for use in variant interpretation for all genes.   02/09/2021 Surgery   Left Lumpectomy: Grade 3 IDC 0.6 cm with DCIS, Ant Margins pos for DCIS, LN Neg, Triple Negative     CHIEF COMPLIANT: Follow-up status post left lumpectomy  INTERVAL HISTORY: Leah Chan is a 60 y.o. with above-mentioned history of malignant neoplasm of UOQ of the left breast. She presents to the clinic today for follow-up.  She underwent lumpectomy and is recovering very well from the recent surgery.  ALLERGIES:  is allergic to latex and sulfa antibiotics.  MEDICATIONS:  Current Outpatient Medications  Medication Sig Dispense Refill   calcium carbonate (TUMS - DOSED IN MG ELEMENTAL CALCIUM) 500 MG chewable tablet Chew 1 tablet by mouth daily as needed for indigestion or heartburn.     escitalopram (LEXAPRO) 10 MG tablet Take 10 mg by mouth daily.     GARLIC PO Take 1 capsule by mouth 4 (four) times a week.     hydrochlorothiazide (HYDRODIURIL) 25 MG tablet Take 50 mg by mouth daily.     hydrOXYzine (ATARAX/VISTARIL) 25 MG tablet Take 50 mg by mouth at bedtime.     Multiple Minerals (CALCIUM-MAGNESIUM-ZINC) TABS Take 1 tablet by mouth daily.     pravastatin (PRAVACHOL) 10 MG tablet Take 10 mg by mouth at bedtime.     traMADol (ULTRAM) 50 MG tablet Take 1 tablet (50 mg total) by mouth every 6 (six) hours as needed. 25 tablet 0   No current facility-administered medications for this visit.    PHYSICAL EXAMINATION: ECOG PERFORMANCE STATUS: 1 - Symptomatic but completely ambulatory  Vitals:   02/25/21 1118  BP: (!) 157/78  Pulse: 66  Resp: 18  Temp: 97.8 F (36.6 C)  SpO2: 100%  Filed Weights   02/25/21 1118  Weight: 131 lb 1.6 oz (59.5 kg)      LABORATORY DATA:  I have reviewed the data as listed CMP Latest Ref Rng & Units 01/21/2021  Glucose 70 - 99 mg/dL 78  BUN 8 - 23 mg/dL 11  Creatinine 0.44 - 1.00 mg/dL 0.87  Sodium 135 - 145 mmol/L 139  Potassium 3.5 - 5.1  mmol/L 3.7  Chloride 98 - 111 mmol/L 101  CO2 22 - 32 mmol/L 30  Calcium 8.9 - 10.3 mg/dL 9.5  Total Protein 6.5 - 8.1 g/dL 7.5  Total Bilirubin 0.3 - 1.2 mg/dL 1.1  Alkaline Phos 38 - 126 U/L 76  AST 15 - 41 U/L 22  ALT 0 - 44 U/L 16    Lab Results  Component Value Date   WBC 6.9 01/21/2021   HGB 14.3 01/21/2021   HCT 41.9 01/21/2021   MCV 86.0 01/21/2021   PLT 222 01/21/2021   NEUTROABS 4.4 01/21/2021    ASSESSMENT & PLAN:  Malignant neoplasm of upper-outer quadrant of left breast in female, estrogen receptor negative (Leah Chan) Screening mammogram detected 2 asymmetric areas with calcifications detected in March 2022.  October 2022 mammogram revealed left breast calcifications 1.8 cm at 1 o'clock position and a mass below the skin measuring 4 mm.  The calcifications were high-grade DCIS, ER/PR negative, mass: Grade 3 IDC, triple negative, Ki-67 15%, axilla negative  04/11/20: Left Lumpectomy: Grade 3 IDC 0.6 cm with DCIS, Ant Margins pos for DCIS, LN Neg, Triple Negative    Treatment plan: 1.  Adjuvant chemo with Taxotere and Cytoxan every 3 weeks x4 2. adjuvant radiation   Chemotherapy Counseling: I discussed the risks and benefits of chemotherapy including the risks of nausea/ vomiting, risk of infection from low WBC count, fatigue due to chemo or anemia, bruising or bleeding due to low platelets, mouth sores, loss/ change in taste and decreased appetite. Liver and kidney function will be monitored through out chemotherapy as abnormalities in liver and kidney function may be a side effect of treatment.  Risk of permanent bone marrow dysfunction, neuropathy from Taxotere and leukemia due to chemo were also discussed.  Genetics: Neg  Return to clinic in 3 weeks to start chemotherapy.  She will need port and chemo class. Counseled her about nausea prevention study   No orders of the defined types were placed in this encounter.  The patient has a good understanding of the overall  plan. she agrees with it. she will call with any problems that may develop before the next visit here.  Total time spent: 30 mins including face to face time and time spent for planning, charting and coordination of care  Rulon Eisenmenger, MD, MPH 02/25/2021  I, Thana Ates, am acting as scribe for Dr. [name].  I have reviewed the above documentation for accuracy and completeness, and I agree with the above.

## 2021-02-25 ENCOUNTER — Other Ambulatory Visit: Payer: Self-pay | Admitting: Surgery

## 2021-02-25 ENCOUNTER — Inpatient Hospital Stay: Payer: Managed Care, Other (non HMO) | Attending: Hematology and Oncology | Admitting: Hematology and Oncology

## 2021-02-25 ENCOUNTER — Other Ambulatory Visit: Payer: Self-pay

## 2021-02-25 DIAGNOSIS — C50412 Malignant neoplasm of upper-outer quadrant of left female breast: Secondary | ICD-10-CM

## 2021-02-25 DIAGNOSIS — Z171 Estrogen receptor negative status [ER-]: Secondary | ICD-10-CM

## 2021-02-25 DIAGNOSIS — Z79899 Other long term (current) drug therapy: Secondary | ICD-10-CM | POA: Insufficient documentation

## 2021-02-25 NOTE — Research (Signed)
[id number] - TREATMENT OF REFRACTORY NAUSEA  Patient Leah Chan was identified by Dr Lindi Adie as a potential candidate for the above listed study.  This Clinical Research Nurse, along with Donell Sievert, Clinical Research Nurse, met with Leah Chan, [phone], on 02/25/21 in a manner and location that ensures patient privacy to discuss participation in the above listed research study.  Patient is Unaccompanied.  A copy of the informed consent document and separate HIPAA Authorization was provided to the patient.  Patient reads, speaks, and understands [place].   Patient was provided with the business card of this Nurse and encouraged to contact the research team with any questions.  Approximately 20 minutes were spent with the patient reviewing the informed consent documents.  Patient was provided the option of taking informed consent documents home to review and was encouraged to review at their convenience with their support network, including other care providers. Patient took the consent documents home to review. Research will follow-up with patient by phone next week. Patient thanked for her time.   Vickii Penna, RN, BSN, CPN Clinical Research Nurse I [phone]  02/25/2021 12:04 PM

## 2021-02-25 NOTE — Progress Notes (Signed)
START ON PATHWAY REGIMEN - Breast ° ° °  A cycle is every 21 days: °    Docetaxel  °    Cyclophosphamide  ° °**Always confirm dose/schedule in your pharmacy ordering system** ° °Patient Characteristics: °Postoperative without Neoadjuvant Therapy (Pathologic Staging), Invasive Disease, Adjuvant Therapy, HER2 Negative/Unknown/Equivocal, ER Negative/Unknown, Node Negative, pT1a-b, N0, Chemotherapy Indicated °Therapeutic Status: Postoperative without Neoadjuvant Therapy (Pathologic Staging) °AJCC Grade: G3 °AJCC N Category: pN0 °AJCC M Category: cM0 °ER Status: Negative (-) °AJCC 8 Stage Grouping: IB °HER2 Status: Negative (-) °Oncotype Dx Recurrence Score: Not Appropriate °AJCC T Category: pT1b °PR Status: Negative (-) °Adjuvant Therapy Status: No Adjuvant Therapy Received Yet or Changing Initial Adjuvant Regimen due to Tolerance °Intervention Indicated: Chemotherapy °Intent of Therapy: °Curative Intent, Discussed with Patient °

## 2021-02-26 ENCOUNTER — Encounter (HOSPITAL_BASED_OUTPATIENT_CLINIC_OR_DEPARTMENT_OTHER): Payer: Self-pay | Admitting: Surgery

## 2021-02-26 ENCOUNTER — Encounter: Payer: Self-pay | Admitting: *Deleted

## 2021-03-02 ENCOUNTER — Telehealth: Payer: Self-pay | Admitting: Hematology and Oncology

## 2021-03-02 ENCOUNTER — Ambulatory Visit: Payer: Managed Care, Other (non HMO) | Attending: Surgery | Admitting: Physical Therapy

## 2021-03-02 ENCOUNTER — Encounter (HOSPITAL_BASED_OUTPATIENT_CLINIC_OR_DEPARTMENT_OTHER)
Admission: RE | Admit: 2021-03-02 | Discharge: 2021-03-02 | Disposition: A | Discharge status: Home / Self Care | Payer: Managed Care, Other (non HMO) | Source: Ambulatory Visit | Attending: Surgery | Admitting: Surgery

## 2021-03-02 ENCOUNTER — Other Ambulatory Visit: Payer: Self-pay

## 2021-03-02 DIAGNOSIS — C50412 Malignant neoplasm of upper-outer quadrant of left female breast: Secondary | ICD-10-CM | POA: Insufficient documentation

## 2021-03-02 DIAGNOSIS — Z452 Encounter for adjustment and management of vascular access device: Secondary | ICD-10-CM | POA: Diagnosis present

## 2021-03-02 DIAGNOSIS — Z171 Estrogen receptor negative status [ER-]: Secondary | ICD-10-CM | POA: Diagnosis not present

## 2021-03-02 DIAGNOSIS — Z170 Estrogen receptor positive status [ER+]: Secondary | ICD-10-CM | POA: Insufficient documentation

## 2021-03-02 DIAGNOSIS — R293 Abnormal posture: Secondary | ICD-10-CM | POA: Diagnosis present

## 2021-03-02 DIAGNOSIS — C50912 Malignant neoplasm of unspecified site of left female breast: Secondary | ICD-10-CM | POA: Diagnosis not present

## 2021-03-02 DIAGNOSIS — Z483 Aftercare following surgery for neoplasm: Secondary | ICD-10-CM | POA: Diagnosis present

## 2021-03-02 DIAGNOSIS — I10 Essential (primary) hypertension: Secondary | ICD-10-CM | POA: Diagnosis not present

## 2021-03-02 DIAGNOSIS — J45909 Unspecified asthma, uncomplicated: Secondary | ICD-10-CM | POA: Diagnosis not present

## 2021-03-02 LAB — BASIC METABOLIC PANEL
Anion gap: 7 (ref 5–15)
BUN: 8 mg/dL (ref 8–23)
CO2: 29 mmol/L (ref 22–32)
Calcium: 9.5 mg/dL (ref 8.9–10.3)
Chloride: 101 mmol/L (ref 98–111)
Creatinine, Ser: 0.85 mg/dL (ref 0.44–1.00)
GFR, Estimated: >60 mL/min (ref >60)
Glucose, Bld: 85 mg/dL (ref 70–99)
Potassium: 3.9 mmol/L (ref 3.5–5.1)
Sodium: 137 mmol/L (ref 135–145)

## 2021-03-02 NOTE — Telephone Encounter (Signed)
Sch per 12/15 sch msg

## 2021-03-02 NOTE — Progress Notes (Signed)
Text message reminder sent to come to [facility] today for lab work, drink and pre surgery soap pick up.

## 2021-03-02 NOTE — H&P (Signed)
Leah Chan is an 60 y.o. female.   Chief Complaint: left breast cancer HPI: she tolerated surgery well.  After further review of the pathology and discussion with oncology, she has decided to proceed with IV chemotherapy.  This will require port a cath insertion.  Past Medical History:  Diagnosis Date   Anxiety    Asthma    Cancer [facility])    Family history of prostate cancer 01/22/2021   Family history of skin cancer 01/22/2021   Genetic testing 02/02/2021   Negative hereditary cancer genetic testing: no pathogenic variants detected in [facility] BRCAPlus Panel.  The report date is January 29, 2021.   The BRCAplus panel offered by [employer] and includes sequencing and deletion/duplication analysis for the following 8 genes: ATM, BRCA1, BRCA2, CDH1, CHEK2, PALB2, PTEN, and TP53.  Results of pan-cancer panel are pending.    Hypertension    Pneumonia 2010    Past Surgical History:  Procedure Laterality Date   BREAST LUMPECTOMY Right 2002   BREAST LUMPECTOMY WITH RADIOACTIVE SEED LOCALIZATION Left 02/09/2021   Procedure: LEFT BREAST LUMPECTOMY WITH RADIOACTIVE SEED LOCALIZATION (2 SEEDS);  Surgeon: [name], MD;  Location: [facility];  Service: General;  Laterality: Left;   SENTINEL NODE BIOPSY Left 02/09/2021   Procedure: LEFT AXILLARY SENTINEL LYMPH NODE BIOPSY;  Surgeon: [name], MD;  Location: [facility];  Service: General;  Laterality: Left;    Family History  Problem Relation Age of Onset   Prostate cancer Father        mets; d. 27   Prostate cancer Maternal Uncle        dx after 28   Social History:  reports that she has never smoked. She does not have any smokeless tobacco history on file. She reports that she does not drink alcohol and does not use drugs.  Allergies:  Allergies  Allergen Reactions   Latex Itching    Makes skin red    Sulfa Antibiotics Nausea And Vomiting    No medications prior to admission.    Results for orders placed or performed  during the hospital encounter of 03/03/21 (from the past 48 hour(s))  Basic metabolic panel per protocol     Status: None   Collection Time: 03/02/21  4:00 PM  Result Value Ref Range   Sodium 137 135 - 145 mmol/L   Potassium 3.9 3.5 - 5.1 mmol/L   Chloride 101 98 - 111 mmol/L   CO2 29 22 - 32 mmol/L   Glucose, Bld 85 70 - 99 mg/dL    Comment: Glucose reference range applies only to samples taken after fasting for at least 8 hours.   BUN 8 8 - 23 mg/dL   Creatinine, Ser 0.85 0.44 - 1.00 mg/dL   Calcium 9.5 8.9 - 10.3 mg/dL   GFR, Estimated >60 >60 mL/min    Comment: (NOTE) Calculated using the CKD-EPI Creatinine Equation (2021)    Anion gap 7 5 - 15    Comment: Performed at [facility] [address] [place], [place] [address]   No results found.  Review of Systems  All other systems reviewed and are negative.  Height 5' 3" (1.6 m), weight 59.4 kg. Physical Exam Constitutional:      Appearance: Normal appearance.  Eyes:     Pupils: Pupils are equal, round, and reactive to light.  Cardiovascular:     Rate and Rhythm: Normal rate and regular rhythm.  Pulmonary:     Effort: Pulmonary effort is  normal.     Breath sounds: Normal breath sounds.  Abdominal:     General: Abdomen is flat.     Tenderness: There is no abdominal tenderness.  Musculoskeletal:     Cervical back: Normal range of motion.  Skin:    General: Skin is warm and dry.  Neurological:     Mental Status: She is alert.     Assessment/Plan Left breast cancer with plans for chemotherapy  We will proceed to the OR for port a cath insertion.  I explained the procedure in detail.  We discussed the risks which include but are not limited to bleeding, infection, injury to surrounding structures, pneumothorax, the need for other procedures, cardiopulmonary issues, etc.  She agrees to proceed.  [name], MD 03/02/2021, 7:34 PM

## 2021-03-02 NOTE — Therapy (Signed)
[facility] @ [place], [place], [address] Phone: [phone]   Fax:  [phone]  Physical Therapy Treatment  Patient Details  Name: Leah Chan MRN: [id number] Date of Birth: Nov 02, 1960 Referring Provider (PT): Dr. [name]   Encounter Date: 03/02/2021   PT End of Session - 03/02/21 1347     Visit Number 2    Number of Visits 2    PT Start Time 6962    PT Stop Time 9528    PT Time Calculation (min) 40 min    Activity Tolerance Patient tolerated treatment well    Behavior During Therapy [facility] for tasks assessed/performed             Past Medical History:  Diagnosis Date   Anxiety    Asthma    Cancer ([facility])    Family history of prostate cancer 01/22/2021   Family history of skin cancer 01/22/2021   Genetic testing 02/02/2021   Negative hereditary cancer genetic testing: no pathogenic variants detected in [facility] Panel.  The report date is January 29, 2021.   The BRCAplus panel offered by [employer] and includes sequencing and deletion/duplication analysis for the following 8 genes: ATM, BRCA1, BRCA2, CDH1, CHEK2, PALB2, PTEN, and TP53.  Results of pan-cancer panel are pending.    Hypertension    Pneumonia 2010    Past Surgical History:  Procedure Laterality Date   BREAST LUMPECTOMY Right 2002   BREAST LUMPECTOMY WITH RADIOACTIVE SEED LOCALIZATION Left 02/09/2021   Procedure: LEFT BREAST LUMPECTOMY WITH RADIOACTIVE SEED LOCALIZATION (2 SEEDS);  Surgeon: [name], MD;  Location: [facility];  Service: General;  Laterality: Left;   SENTINEL NODE BIOPSY Left 02/09/2021   Procedure: LEFT AXILLARY SENTINEL LYMPH NODE BIOPSY;  Surgeon: [name], MD;  Location: [facility];  Service: General;  Laterality: Left;    There were no vitals filed for this visit.   Subjective Assessment - 03/02/21 1306     Subjective Patient reports she underwent a left lumpectomy and sentinel node  biopsy (2 negative nodes) on 02/09/2021. She will get her port placed tomorrow and begin chemotherapy next week probably followed by radiation.    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 She underwent a left lumpectomy and sentinel node biopsy (2 negative nodes) on 02/09/2021. It is triple negative with a Ki67 of 15%.    Patient Stated Goals See how my arm is doing    Currently in Pain? Yes    Pain Score 5     Pain Location Breast    Pain Orientation Left    Pain Descriptors / Indicators Shooting    Pain Type Surgical pain    Pain Onset 1 to 4 weeks ago    Pain Frequency Intermittent    Aggravating Factors  Nothing    Pain Relieving Factors Nothing                [facility] PT Assessment - 03/02/21 0001       Assessment   Medical Diagnosis s/p left lumpectomy and SLNB    Referring Provider (PT) Dr. [name]    Onset Date/Surgical Date 02/09/21    Hand Dominance Right    Prior Therapy Baselines      Precautions   Precautions Other (comment)    Precaution Comments recent surgery and left arm lymphedema risk      Restrictions   Weight Bearing Restrictions  No      Balance Screen   Has the patient fallen in the past 6 months No    Has the patient had a decrease in activity level because of a fear of falling?  No    Is the patient reluctant to leave their home because of a fear of falling?  No      Home Ecologist residence    Living Arrangements Spouse/significant other;Other relatives   Husband and 92 y.o. 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 Mild edema present in left lateral breast. Incisions still covered by glue but no redness or signs of infection noted. Scar tissue present with tissue tightness.      Posture/Postural Control   Posture/Postural Control Postural limitations    Postural Limitations Rounded Shoulders;Forward head      ROM / Strength   AROM / PROM / Strength AROM      AROM   AROM Assessment Site Shoulder    Right/Left Shoulder Left    Left Shoulder Extension 56 Degrees    Left Shoulder Flexion 141 Degrees    Left Shoulder ABduction 151 Degrees    Left Shoulder Internal Rotation 73 Degrees    Left Shoulder External Rotation 76 Degrees               LYMPHEDEMA/ONCOLOGY QUESTIONNAIRE - 03/02/21 0001       Type   Cancer Type Left breast cancer      Surgeries   Lumpectomy Date 02/09/21    Sentinel Lymph Node Biopsy Date 02/09/21    Number Lymph Nodes Removed 2      Treatment   Active Chemotherapy Treatment No    Past Chemotherapy Treatment No    Active Radiation Treatment No    Past Radiation Treatment No    Current Hormone Treatment No    Past Hormone Therapy No      What other symptoms do you have   Are you Having Heaviness or Tightness No    Are you having Pain Yes    Are you having pitting edema No    Is it Hard or Difficult finding clothes that fit No    Do you have infections No    Is there Decreased scar mobility Yes    Stemmer Sign No      Lymphedema Assessments   Lymphedema Assessments Upper extremities      Right Upper Extremity Lymphedema   10 cm Proximal to Olecranon Process 25.5 cm    Olecranon Process 23.6 cm    10 cm Proximal to Ulnar Styloid Process 20.5 cm    Just Proximal to Ulnar Styloid Process 15.5 cm    Across Hand at [employer] 18.4 cm    At [place] of 2nd Digit 6.1 cm      Left Upper Extremity Lymphedema   10 cm Proximal to Olecranon Process 25.4 cm    Olecranon Process  23.6 cm    10 cm Proximal to Ulnar Styloid Process 20.2 cm  Just Proximal to Ulnar Styloid Process 15.4 cm    Across Hand at [employer] 18.1 cm    At [place] of 2nd Digit 6 cm                Katina Dung - 03/02/21 0001     Open a tight or new jar Moderate difficulty    Do heavy household chores (wash walls, wash floors) No difficulty    Carry a shopping bag or briefcase No difficulty    Wash your back No difficulty    Use a knife to cut food No difficulty    Recreational activities in which you take some force or impact through your arm, shoulder, or hand (golf, hammering, tennis) Mild difficulty    During the past week, to what extent has your arm, shoulder or hand problem interfered with your normal social activities with family, friends, neighbors, or groups? Not at all    During the past week, to what extent has your arm, shoulder or hand problem limited your work or other regular daily activities Slightly    Arm, shoulder, or hand pain. Mild    Tingling (pins and needles) in your arm, shoulder, or hand None    Difficulty Sleeping Moderate difficulty    DASH Score 15.91 %                             PT Education - 03/02/21 1347     Education Details Aftercare; scar massage    Person(s) Educated Patient    Methods Explanation;Demonstration;Handout    Comprehension Verbalized understanding;Returned demonstration                 PT Long Term Goals - 03/02/21 1350       PT LONG TERM GOAL #1   Title Patient will demonstrate she has regained full shoulder ROM and function post operatively compared to baselines.    Time 8    Period Weeks    Status Achieved                   Plan - 03/02/21 1347     Clinical Impression Statement Patient is doing well s/p left lumpectomy and sentinel node biopsy on 02/09/2021 with 2 negative axillary nodes removed. She is going to have a port placed tomorrow and begin chemo possibly next week for  her triple negative breast cancer. She has regained full shoulder ROM and function, shows no signs of lymphedema and her incisions are healing well. She plans to attend the After Breast Cancer class on 03/30/2021 but otherwise has no PT needs at this time.    PT Treatment/Interventions ADLs/Self Care Home Management;Therapeutic exercise;Patient/family education    PT Next Visit Plan D/C    PT Home Exercise Plan Post op HEP    Consulted and Agree with Plan of Care Patient             Patient will benefit from skilled therapeutic intervention in order to improve the following deficits and impairments:  Postural dysfunction, Decreased range of motion, Decreased knowledge of precautions, Impaired UE functional use, Pain  Visit Diagnosis: Malignant neoplasm of upper-outer quadrant of left breast in female, estrogen receptor positive ([facility])  Abnormal posture  Aftercare following surgery for neoplasm     Problem List Patient Active Problem List   Diagnosis Date Noted   Genetic testing 02/02/2021   Family history of prostate cancer 01/22/2021  Family history of skin cancer 01/22/2021   Malignant neoplasm of upper-outer quadrant of left breast in female, estrogen receptor negative ([facility]) 01/20/2021   PHYSICAL THERAPY DISCHARGE SUMMARY  Visits from Start of Care: 2  Current functional level related to goals / functional outcomes: Goals met. See above for objective findings.   Remaining deficits: None   Education / Equipment: Lymphedema education and post op HEP  Patient agrees to discharge. Patient goals were met. Patient is being discharged due to meeting the stated rehab goals.  Annia Friendly, [place] 03/02/21 1:54 PM    [facility] @ [place], [place], [address] Phone: [phone]   Fax:  [phone]  Name: Leah Chan MRN: [id number] Date of Birth: [birth year]

## 2021-03-02 NOTE — Patient Instructions (Addendum)
° ° °   [facility]  [address], [address]  [place] [address]  [phone]  After Breast Cancer Class It is recommended you attend the ABC class to be educated on lymphedema risk reduction. This class is free of charge and lasts for 1 hour. It is a 1-time class. You will need to download the Webex app either on your phone or computer. We will send you a link the night before or the morning of the class. You should be able to click on that link to join the class. This is not a confidential class. You don't have to turn your camera on, but other participants may be able to see your email address. You are scheduled for January 16th 2023 for this class.  Scar massage You can begin gentle scar massage to you incision sites. Gently place one hand on the incision and move the skin (without sliding on the skin) in various directions. Do this for a few minutes and then you can gently massage either coconut oil or vitamin E cream into the scars. You need to wait until the glue is gone from your incisions.  Compression garment You should continue wearing your compression bra until you feel like you no longer have swelling.  Home exercise Program Continue doing the exercises you were given until you feel like you can do them without feeling any tightness at the end. You should continue these once a week during chemotherapy and tehn once a day during radiation.  Walking Program Studies show that 30 minutes of walking per day (fast enough to elevate your heart rate) can significantly reduce the risk of a cancer recurrence. If you can't walk due to other medical reasons, we encourage you to find another activity you could do (like a stationary bike or water exercise).  Posture After breast cancer surgery, people frequently sit with rounded shoulders posture because it puts their incisions on slack and feels better. If you sit like this and scar tissue forms in that position, you can  become very tight and have pain sitting or standing with good posture. Try to be aware of your posture and sit and stand up tall to heal properly.  Follow up PT: It is recommended you return every 3 months for the first 3 years following surgery to be assessed on the SOZO machine for an L-Dex score. This helps prevent clinically significant lymphedema in 95% of patients. These follow up screens are 10 minute appointments that you are not billed for. You are scheduled for February 20th at 10:20.

## 2021-03-02 NOTE — Progress Notes (Signed)
° ° ° ° °  Enhanced Recovery after Surgery for Orthopedics Enhanced Recovery after Surgery is a protocol used to improve the stress on your body and your recovery after surgery.  Patient Instructions  The night before surgery:  No food after midnight. ONLY clear liquids after midnight  The day of surgery (if you do NOT have diabetes):  Drink ONE (1) Pre-Surgery Clear Ensure as directed.   This drink was given to you during your hospital  pre-op appointment visit. The pre-op nurse will instruct you on the time to drink the  Pre-Surgery Ensure depending on your surgery time. Finish the drink at the designated time by the pre-op nurse.  Nothing else to drink after completing the  Pre-Surgery Clear Ensure.  The day of surgery (if you have diabetes): Drink ONE (1) Gatorade 2 (G2) as directed. This drink was given to you during your hospital  pre-op appointment visit.  The pre-op nurse will instruct you on the time to drink the   Gatorade 2 (G2) depending on your surgery time. Color of the Gatorade may vary. Red is not allowed. Nothing else to drink after completing the  Gatorade 2 (G2).         If you have questions, please contact your surgeons office.  Pre surgical soap given with instructions. Patient verbalized understanding.

## 2021-03-03 ENCOUNTER — Other Ambulatory Visit: Payer: Self-pay

## 2021-03-03 ENCOUNTER — Ambulatory Visit (HOSPITAL_BASED_OUTPATIENT_CLINIC_OR_DEPARTMENT_OTHER): Payer: Managed Care, Other (non HMO) | Admitting: Anesthesiology

## 2021-03-03 ENCOUNTER — Ambulatory Visit (HOSPITAL_COMMUNITY): Payer: Managed Care, Other (non HMO)

## 2021-03-03 ENCOUNTER — Encounter (HOSPITAL_BASED_OUTPATIENT_CLINIC_OR_DEPARTMENT_OTHER): Payer: Self-pay | Admitting: Surgery

## 2021-03-03 ENCOUNTER — Encounter (HOSPITAL_BASED_OUTPATIENT_CLINIC_OR_DEPARTMENT_OTHER)
Admission: RE | Disposition: A | Discharge status: Home / Self Care | Payer: Self-pay | Source: Home / Self Care | Attending: Surgery

## 2021-03-03 ENCOUNTER — Ambulatory Visit (HOSPITAL_BASED_OUTPATIENT_CLINIC_OR_DEPARTMENT_OTHER)
Admission: RE | Admit: 2021-03-03 | Discharge: 2021-03-03 | Disposition: A | Discharge status: Home / Self Care | Payer: Managed Care, Other (non HMO) | Attending: Surgery | Admitting: Surgery

## 2021-03-03 DIAGNOSIS — Z452 Encounter for adjustment and management of vascular access device: Secondary | ICD-10-CM | POA: Diagnosis not present

## 2021-03-03 DIAGNOSIS — J45909 Unspecified asthma, uncomplicated: Secondary | ICD-10-CM | POA: Insufficient documentation

## 2021-03-03 DIAGNOSIS — I10 Essential (primary) hypertension: Secondary | ICD-10-CM | POA: Insufficient documentation

## 2021-03-03 DIAGNOSIS — Z171 Estrogen receptor negative status [ER-]: Secondary | ICD-10-CM | POA: Insufficient documentation

## 2021-03-03 DIAGNOSIS — C50912 Malignant neoplasm of unspecified site of left female breast: Secondary | ICD-10-CM | POA: Insufficient documentation

## 2021-03-03 DIAGNOSIS — Z419 Encounter for procedure for purposes other than remedying health state, unspecified: Secondary | ICD-10-CM

## 2021-03-03 HISTORY — PX: PORTACATH PLACEMENT: SHX2246

## 2021-03-03 LAB — POCT PREGNANCY, URINE: Preg Test, Ur: NEGATIVE

## 2021-03-03 SURGERY — INSERTION, TUNNELED CENTRAL VENOUS DEVICE, WITH PORT
Anesthesia: General | Site: Chest | Laterality: Right

## 2021-03-03 MED ORDER — ACETAMINOPHEN 500 MG PO TABS
1000.0000 mg | ORAL_TABLET | ORAL | Status: AC
  Administered 2021-03-03: 10:00:00 1000 mg via ORAL

## 2021-03-03 MED ORDER — CEFAZOLIN SODIUM-DEXTROSE 2-4 GM/100ML-% IV SOLN
2.0000 g | INTRAVENOUS | Status: AC
  Administered 2021-03-03: 12:00:00 2 g via INTRAVENOUS

## 2021-03-03 MED ORDER — FENTANYL CITRATE (PF) 100 MCG/2ML IJ SOLN
INTRAMUSCULAR | Status: DC | PRN
  Administered 2021-03-03: 50 ug via INTRAVENOUS

## 2021-03-03 MED ORDER — LIDOCAINE HCL (CARDIAC) PF 100 MG/5ML IV SOSY
PREFILLED_SYRINGE | INTRAVENOUS | Status: DC | PRN
  Administered 2021-03-03: 80 mg via INTRATRACHEAL

## 2021-03-03 MED ORDER — EPHEDRINE SULFATE 50 MG/ML IJ SOLN
INTRAMUSCULAR | Status: DC | PRN
  Administered 2021-03-03: 10 mg via INTRAVENOUS

## 2021-03-03 MED ORDER — OXYCODONE HCL 5 MG PO TABS: 5.0000 mg | ORAL_TABLET | Freq: Once | ORAL | Status: DC | PRN

## 2021-03-03 MED ORDER — HEPARIN SOD (PORK) LOCK FLUSH 100 UNIT/ML IV SOLN
INTRAVENOUS | Status: DC | PRN
  Administered 2021-03-03: 500 [IU]

## 2021-03-03 MED ORDER — LACTATED RINGERS IV SOLN: INTRAVENOUS | Status: DC

## 2021-03-03 MED ORDER — EPINEPHRINE PF 1 MG/ML IJ SOLN
INTRAMUSCULAR | Status: AC
  Filled 2021-03-03: qty 2

## 2021-03-03 MED ORDER — ACETAMINOPHEN 500 MG PO TABS
ORAL_TABLET | ORAL | Status: AC
  Filled 2021-03-03: qty 2

## 2021-03-03 MED ORDER — ONDANSETRON HCL 4 MG/2ML IJ SOLN
INTRAMUSCULAR | Status: DC | PRN
  Administered 2021-03-03: 4 mg via INTRAVENOUS

## 2021-03-03 MED ORDER — EPHEDRINE 5 MG/ML INJ
INTRAVENOUS | Status: AC
  Filled 2021-03-03: qty 5

## 2021-03-03 MED ORDER — DEXAMETHASONE SODIUM PHOSPHATE 10 MG/ML IJ SOLN
INTRAMUSCULAR | Status: DC | PRN
  Administered 2021-03-03: 4 mg via INTRAVENOUS

## 2021-03-03 MED ORDER — FENTANYL CITRATE (PF) 100 MCG/2ML IJ SOLN: 25.0000 ug | INTRAMUSCULAR | Status: DC | PRN

## 2021-03-03 MED ORDER — MIDAZOLAM HCL 5 MG/5ML IJ SOLN
INTRAMUSCULAR | Status: DC | PRN
  Administered 2021-03-03: 2 mg via INTRAVENOUS

## 2021-03-03 MED ORDER — PROPOFOL 10 MG/ML IV BOLUS
INTRAVENOUS | Status: AC
  Filled 2021-03-03: qty 20

## 2021-03-03 MED ORDER — CHLORHEXIDINE GLUCONATE CLOTH 2 % EX PADS: 6.0000 | MEDICATED_PAD | Freq: Once | CUTANEOUS | Status: DC

## 2021-03-03 MED ORDER — HEPARIN SOD (PORK) LOCK FLUSH 100 UNIT/ML IV SOLN
INTRAVENOUS | Status: AC
  Filled 2021-03-03: qty 5

## 2021-03-03 MED ORDER — HEPARIN (PORCINE) IN NACL 1000-0.9 UT/500ML-% IV SOLN
INTRAVENOUS | Status: AC
  Filled 2021-03-03: qty 500

## 2021-03-03 MED ORDER — BUPIVACAINE-EPINEPHRINE (PF) 0.5% -1:200000 IJ SOLN
INTRAMUSCULAR | Status: AC
  Filled 2021-03-03: qty 60

## 2021-03-03 MED ORDER — CEFAZOLIN SODIUM-DEXTROSE 2-4 GM/100ML-% IV SOLN
INTRAVENOUS | Status: AC
  Filled 2021-03-03: qty 100

## 2021-03-03 MED ORDER — PROMETHAZINE HCL 25 MG/ML IJ SOLN: 6.2500 mg | INTRAMUSCULAR | Status: DC | PRN

## 2021-03-03 MED ORDER — BUPIVACAINE-EPINEPHRINE 0.5% -1:200000 IJ SOLN
INTRAMUSCULAR | Status: DC | PRN
  Administered 2021-03-03: 10 mL

## 2021-03-03 MED ORDER — SODIUM BICARBONATE 4.2 % IV SOLN
INTRAVENOUS | Status: AC
  Filled 2021-03-03: qty 20

## 2021-03-03 MED ORDER — LIDOCAINE HCL (PF) 1 % IJ SOLN
INTRAMUSCULAR | Status: AC
  Filled 2021-03-03: qty 60

## 2021-03-03 MED ORDER — PROPOFOL 10 MG/ML IV BOLUS
INTRAVENOUS | Status: DC | PRN
  Administered 2021-03-03: 150 mg via INTRAVENOUS

## 2021-03-03 MED ORDER — FENTANYL CITRATE (PF) 100 MCG/2ML IJ SOLN
INTRAMUSCULAR | Status: AC
  Filled 2021-03-03: qty 2

## 2021-03-03 MED ORDER — MIDAZOLAM HCL 2 MG/2ML IJ SOLN
INTRAMUSCULAR | Status: AC
  Filled 2021-03-03: qty 2

## 2021-03-03 MED ORDER — OXYCODONE HCL 5 MG/5ML PO SOLN: 5.0000 mg | Freq: Once | ORAL | Status: DC | PRN

## 2021-03-03 MED ORDER — ACETAMINOPHEN 500 MG PO TABS: 1000.0000 mg | ORAL_TABLET | Freq: Once | ORAL | Status: DC

## 2021-03-03 MED ORDER — AMISULPRIDE (ANTIEMETIC) 5 MG/2ML IV SOLN: 10.0000 mg | Freq: Once | INTRAVENOUS | Status: DC | PRN

## 2021-03-03 MED ORDER — HEPARIN (PORCINE) IN NACL 2-0.9 UNITS/ML
INTRAMUSCULAR | Status: AC | PRN
  Administered 2021-03-03: 2 mL

## 2021-03-03 SURGICAL SUPPLY — 42 items
ADH SKN CLS APL DERMABOND .7 (GAUZE/BANDAGES/DRESSINGS) ×2
APL PRP STRL LF DISP 70% ISPRP (MISCELLANEOUS) ×1
BAG DECANTER FOR FLEXI CONT (MISCELLANEOUS) ×3 IMPLANT
BLADE SURG 15 STRL LF DISP TIS (BLADE) ×1 IMPLANT
BLADE SURG 15 STRL SS (BLADE) ×3
CANISTER SUCT 1200ML W/VALVE (MISCELLANEOUS) IMPLANT
CHLORAPREP W/TINT 26 (MISCELLANEOUS) ×3 IMPLANT
COVER BACK TABLE 60X90IN (DRAPES) ×3 IMPLANT
COVER MAYO STAND STRL (DRAPES) ×3 IMPLANT
DECANTER SPIKE VIAL GLASS SM (MISCELLANEOUS) IMPLANT
DERMABOND ADVANCED (GAUZE/BANDAGES/DRESSINGS) ×4
DERMABOND ADVANCED .7 DNX12 (GAUZE/BANDAGES/DRESSINGS) ×2 IMPLANT
DRAPE C-ARM 42X72 X-RAY (DRAPES) ×3 IMPLANT
DRAPE LAPAROSCOPIC ABDOMINAL (DRAPES) ×3 IMPLANT
DRAPE UTILITY XL STRL (DRAPES) ×3 IMPLANT
ELECT REM PT RETURN 9FT ADLT (ELECTROSURGICAL) ×3
ELECTRODE REM PT RTRN 9FT ADLT (ELECTROSURGICAL) ×1 IMPLANT
GLOVE SURG POLYISO LF SZ7 (GLOVE) ×2 IMPLANT
GLOVE SURG POLYISO LF SZ7.5 (GLOVE) ×2 IMPLANT
GLOVE SURG SIGNA 7.5 PF LTX (GLOVE) ×1 IMPLANT
GOWN STRL REUS W/ TWL LRG LVL3 (GOWN DISPOSABLE) ×1 IMPLANT
GOWN STRL REUS W/ TWL XL LVL3 (GOWN DISPOSABLE) ×1 IMPLANT
GOWN STRL REUS W/TWL LRG LVL3 (GOWN DISPOSABLE) ×3
GOWN STRL REUS W/TWL XL LVL3 (GOWN DISPOSABLE) ×3
IV KIT MINILOC 20X1 SAFETY (NEEDLE) IMPLANT
KIT PORT POWER 8FR ISP CVUE (Port) ×2 IMPLANT
NDL HYPO 25X1 1.5 SAFETY (NEEDLE) ×1 IMPLANT
NEEDLE HYPO 25X1 1.5 SAFETY (NEEDLE) ×3 IMPLANT
PACK BASIN DAY SURGERY FS (CUSTOM PROCEDURE TRAY) ×3 IMPLANT
PENCIL SMOKE EVACUATOR (MISCELLANEOUS) ×3 IMPLANT
SLEEVE SCD COMPRESS KNEE MED (STOCKING) ×3 IMPLANT
SUT MNCRL AB 4-0 PS2 18 (SUTURE) ×3 IMPLANT
SUT PROLENE 2 0 SH DA (SUTURE) ×3 IMPLANT
SUT SILK 2 0 TIES 17X18 (SUTURE) ×3
SUT SILK 2-0 18XBRD TIE BLK (SUTURE) IMPLANT
SUT VIC AB 3-0 SH 27 (SUTURE) ×3
SUT VIC AB 3-0 SH 27X BRD (SUTURE) ×1 IMPLANT
SYR CONTROL 10ML LL (SYRINGE) ×3 IMPLANT
TOWEL GREEN STERILE FF (TOWEL DISPOSABLE) ×3 IMPLANT
TUBE CONNECTING 20'X1/4 (TUBING)
TUBE CONNECTING 20X1/4 (TUBING) IMPLANT
YANKAUER SUCT BULB TIP NO VENT (SUCTIONS) IMPLANT

## 2021-03-03 NOTE — Discharge Instructions (Addendum)
You may shower starting tomorrow  Ice pack, Tylenol, and ibuprofen also for pain  No vigorous activity for 1 week   Post Anesthesia Home Care Instructions  Activity: Get plenty of rest for the remainder of the day. A responsible individual must stay with you for 24 hours following the procedure.  For the next 24 hours, DO NOT: -Drive a car -Paediatric nurse -Drink alcoholic beverages -Take any medication unless instructed by your physician -Make any legal decisions or sign important papers.  Meals: Start with liquid foods such as gelatin or soup. Progress to regular foods as tolerated. Avoid greasy, spicy, heavy foods. If nausea and/or vomiting occur, drink only clear liquids until the nausea and/or vomiting subsides. Call your physician if vomiting continues.  Special Instructions/Symptoms: Your throat may feel dry or sore from the anesthesia or the breathing tube placed in your throat during surgery. If this causes discomfort, gargle with warm salt water. The discomfort should disappear within 24 hours.  You may take Tylenol at 4pm today if needed for pain.

## 2021-03-03 NOTE — Progress Notes (Signed)
Per PA request with insurance preference [email or web address] changed to [email or web address]

## 2021-03-03 NOTE — Anesthesia Preprocedure Evaluation (Addendum)
Anesthesia Evaluation  Patient identified by MRN, date of birth, ID band Patient awake    Reviewed: Allergy & Precautions, NPO status , Patient's Chart, lab work & pertinent test results  Airway Mallampati: III  TM Distance: >3 FB Neck ROM: Full    Dental no notable dental hx. (+) Edentulous Lower, Edentulous Upper   Pulmonary asthma ,    Pulmonary exam normal breath sounds clear to auscultation       Cardiovascular Exercise Tolerance: Good hypertension, Pt. on medications Normal cardiovascular exam Rhythm:Regular Rate:Normal  Normal sinus rhythm Nonspecific ST abnormality Abnormal ECG No previous ECGs available Confirmed by End, Harrell Gave [phone]) on 02/02/2021 9:57:16 AM   Neuro/Psych PSYCHIATRIC DISORDERS Anxiety negative neurological ROS     GI/Hepatic negative GI ROS, Neg liver ROS,   Endo/Other  negative endocrine ROS  Renal/GU negative Renal ROS  negative genitourinary   Musculoskeletal negative musculoskeletal ROS (+)   Abdominal   Peds negative pediatric ROS (+)  Hematology negative hematology ROS (+)   Anesthesia Other Findings Breast cancer  Reproductive/Obstetrics negative OB ROS                            Anesthesia Physical Anesthesia Plan  ASA: 3  Anesthesia Plan: General   Post-op Pain Management:    Induction: Intravenous  PONV Risk Score and Plan: 3 and Treatment may vary due to age or medical condition, Midazolam and Ondansetron  Airway Management Planned: LMA  Additional Equipment:   Intra-op Plan:   Post-operative Plan: Extubation in OR  Informed Consent: I have reviewed the patients History and Physical, chart, labs and discussed the procedure including the risks, benefits and alternatives for the proposed anesthesia with the patient or authorized representative who has indicated his/her understanding and acceptance.     Dental advisory  given  Plan Discussed with: CRNA, Anesthesiologist and Surgeon  Anesthesia Plan Comments:        Anesthesia Quick Evaluation

## 2021-03-03 NOTE — Anesthesia Postprocedure Evaluation (Signed)
Anesthesia Post Note  Patient: Leah Chan  Procedure(s) Performed: INSERTION PORT-A-CATH (Right: Chest)     Patient location during evaluation: PACU Anesthesia Type: General Level of consciousness: sedated Pain management: pain level controlled Vital Signs Assessment: post-procedure vital signs reviewed and stable Respiratory status: spontaneous breathing and respiratory function stable Cardiovascular status: stable Postop Assessment: no apparent nausea or vomiting Anesthetic complications: no   No notable events documented.  Last Vitals:  Vitals:   03/03/21 1300 03/03/21 1315  BP: (!) 118/56 (!) 115/57  Pulse: 63 61  Resp: 14 14  Temp:    SpO2: 97% 98%    Last Pain:  Vitals:   03/03/21 1315  TempSrc:   PainSc: 0-No pain                 Merlinda Frederick

## 2021-03-03 NOTE — Anesthesia Procedure Notes (Signed)
Procedure Name: LMA Insertion Date/Time: 03/03/2021 12:04 PM Performed by: Glory Buff, CRNA Pre-anesthesia Checklist: Patient identified, Emergency Drugs available, Suction available and Patient being monitored Patient Re-evaluated:Patient Re-evaluated prior to induction Oxygen Delivery Method: Circle system utilized Preoxygenation: Pre-oxygenation with 100% oxygen Induction Type: IV induction LMA: LMA inserted LMA Size: 4.0 Number of attempts: 1 Placement Confirmation: positive ETCO2 Tube secured with: Tape Dental Injury: Teeth and Oropharynx as per pre-operative assessment

## 2021-03-03 NOTE — Interval H&P Note (Signed)
History and Physical Interval Note: no change in H and P  03/03/2021 9:36 AM  Leah Chan  has presented today for surgery, with the diagnosis of BREAST CANCER.  The various methods of treatment have been discussed with the patient and family. After consideration of risks, benefits and other options for treatment, the patient has consented to  Procedure(s): INSERTION PORT-A-CATH (N/A) as a surgical intervention.  The patient's history has been reviewed, patient examined, no change in status, stable for surgery.  I have reviewed the patient's chart and labs.  Questions were answered to the patient's satisfaction.     [name]

## 2021-03-03 NOTE — Op Note (Signed)
INSERTION PORT-A-CATH  Procedure Note  Leah Chan 03/03/2021   Pre-op Diagnosis: BREAST CANCER     Post-op Diagnosis: Same  Procedure(s): INSERTION PORT-A-CATH (8 French Clearview port) RIGHT SUBCLAVIAN VEIN  Surgeon(s): [name], MD  Anesthesia: General  Staff:  Circulator: Faythe Dingwall, RN Radiology Technologist: Marlan Palau Scrub Person: Murvin Natal  Estimated Blood Loss: Minimal               Indications: This is a 60 year old female with a triple negative left breast cancer.  The decision has been made to proceed with intravenous chemotherapy therefore Port-A-Cath will be inserted  Procedure: The patient is brought to operating room identifies correct patient.  She is placed upon the operating table and general anesthesia was induced.  Her right chest and neck were then prepped and draped in usual sterile fashion.  The patient was placed in Trendelenburg position.  I anesthetized the skin of the right upper chest and clavicle with Marcaine.  I then used the introducer needle to easily cannulate the right subclavian vein.  A wire was passed through the needle and into the central venous system under direct fluoroscopy.  I then anesthetized the skin further and made an incision at the wire site with a scalpel.  I then dissected down into the subcutaneous tissue with the cautery.  I next created a pocket for the port.  An 8 French Clearview port was brought to the field.  The port and catheter were flushed.  I passed the dilator and introducer sheath over the wire and into the central venous system.  I then removed the wire and dilator.  I attached the catheter to the port and it fit into the pocket well.  I then cut the catheter appropriate length.  I then fed the catheter down the peel-away sheath and is in the central venous system.  I accessed the port and good flush and return were demonstrated with dilute heparinized saline solution.  Placement in the  superior vena cava was confirmed with fluoroscopy.  I then secured the port to the chest wall 2 separate 3-0 Prolene sutures.  I then closed the subtenons tissue with interrupted 3-0 Vicryl sutures and closed skin with a running 4-0 Monocryl.  I accessed the port again and instilled with 5 cc of concentrated heparin solution.  Dermabond was then applied.  The patient tolerated the procedure well.  All the counts were correct at the end of the procedure.  The patient was then extubated in the operating room and taken in a stable addition to the recovery room.          [name]   Date: 03/03/2021  Time: 12:36 PM

## 2021-03-03 NOTE — Transfer of Care (Signed)
Immediate Anesthesia Transfer of Care Note  Patient: Leah Chan  Procedure(s) Performed: INSERTION PORT-A-CATH (Right: Chest)  Patient Location: PACU  Anesthesia Type:General  Level of Consciousness: drowsy, patient cooperative and responds to stimulation  Airway & Oxygen Therapy: Patient Spontanous Breathing and Patient connected to face mask oxygen  Post-op Assessment: Report given to RN and Post -op Vital signs reviewed and stable  Post vital signs: Reviewed and stable  Last Vitals:  Vitals Value Taken Time  BP    Temp    Pulse 69 03/03/21 1241  Resp 13 03/03/21 1241  SpO2 99 % 03/03/21 1241  Vitals shown include unvalidated device data.  Last Pain:  Vitals:   03/03/21 0959  TempSrc: Oral  PainSc: 0-No pain      Patients Stated Pain Goal: 3 (28/63/81 7711)  Complications: No notable events documented.

## 2021-03-04 ENCOUNTER — Encounter: Payer: Self-pay | Admitting: *Deleted

## 2021-03-04 ENCOUNTER — Encounter (HOSPITAL_BASED_OUTPATIENT_CLINIC_OR_DEPARTMENT_OTHER): Payer: Self-pay | Admitting: Surgery

## 2021-03-05 ENCOUNTER — Other Ambulatory Visit: Payer: Self-pay | Admitting: Hematology and Oncology

## 2021-03-05 ENCOUNTER — Telehealth: Payer: Self-pay

## 2021-03-05 ENCOUNTER — Ambulatory Visit: Payer: Managed Care, Other (non HMO) | Admitting: Physical Therapy

## 2021-03-05 DIAGNOSIS — Z171 Estrogen receptor negative status [ER-]: Secondary | ICD-10-CM

## 2021-03-05 MED ORDER — DEXAMETHASONE 4 MG PO TABS: 4.0000 mg | ORAL_TABLET | Freq: Two times a day (BID) | ORAL | 0 refills | Status: DC

## 2021-03-05 MED ORDER — ONDANSETRON HCL 8 MG PO TABS: 8.0000 mg | ORAL_TABLET | Freq: Two times a day (BID) | ORAL | 1 refills | Status: DC | PRN

## 2021-03-05 MED ORDER — LIDOCAINE-PRILOCAINE 2.5-2.5 % EX CREA: TOPICAL_CREAM | CUTANEOUS | 3 refills | Status: DC

## 2021-03-05 NOTE — Telephone Encounter (Signed)
[id number] - TREATMENT OF REFRACTORY NAUSEA  Called and spoke with patient to follow-up on interest in above study. Patient stated that she was still interested, but had not fully reviewed the materials yet. This nurse will follow-up with patient next week, after the holidays. Patient thanked for her time.  Vickii Penna, RN, BSN, CPN Clinical Research Nurse I [phone]  03/05/2021 10:28 AM

## 2021-03-06 ENCOUNTER — Inpatient Hospital Stay: Payer: Managed Care, Other (non HMO)

## 2021-03-06 ENCOUNTER — Other Ambulatory Visit: Payer: Self-pay

## 2021-03-06 ENCOUNTER — Other Ambulatory Visit: Payer: Self-pay | Admitting: Hematology and Oncology

## 2021-03-06 MED ORDER — PROCHLORPERAZINE MALEATE 10 MG PO TABS: 10.0000 mg | ORAL_TABLET | Freq: Four times a day (QID) | ORAL | 3 refills | Status: DC | PRN

## 2021-03-13 ENCOUNTER — Telehealth: Payer: Self-pay | Admitting: Emergency Medicine

## 2021-03-13 NOTE — Progress Notes (Signed)
Pharmacist Chemotherapy Monitoring - Initial Assessment    Anticipated start date: 03/23/20   The following has been reviewed per standard work regarding the patient's treatment regimen: The patient's diagnosis, treatment plan and drug doses, and organ/hematologic function Lab orders and baseline tests specific to treatment regimen  The treatment plan start date, drug sequencing, and pre-medications Prior authorization status  Patient's documented medication list, including drug-drug interaction screen and prescriptions for anti-emetics and supportive care specific to the treatment regimen The drug concentrations, fluid compatibility, administration routes, and timing of the medications to be used The patient's access for treatment and lifetime cumulative dose history, if applicable  The patient's medication allergies and previous infusion related reactions, if applicable   Changes made to treatment plan:  N/A  Follow up needed:  Pending authorization for treatment    Larene Beach, [place], 03/13/2021  4:12 PM

## 2021-03-13 NOTE — Telephone Encounter (Signed)
[id number] - TREATMENT OF REFRACTORY NAUSEA  Received call from pt who wanted to discuss research study further.  After going over study in more detail pt has declined study at this time as she feels overwhelmed and is worried about potential side effects of study provided medications.  Patient is interested in doing DCP-001 demographic study.  Pt will be approached at next appt by RN Vickii Penna with consents/further info about DCP.  Pt denies any questions/concerns at this time but is aware to call back before then as needed.  Wells Guiles 'Learta CoddingNeysa Bonito, RN, BSN Clinical Research Nurse I 03/13/21 1:57 PM

## 2021-03-17 ENCOUNTER — Other Ambulatory Visit: Payer: Self-pay | Admitting: Hematology and Oncology

## 2021-03-17 ENCOUNTER — Other Ambulatory Visit: Payer: Self-pay | Admitting: *Deleted

## 2021-03-18 ENCOUNTER — Telehealth: Payer: Self-pay

## 2021-03-18 NOTE — Telephone Encounter (Signed)
Patient notified of completion of disability form. Fax transmission confirmation received and copy mailed to Patient as requested.

## 2021-03-20 MED FILL — Dexamethasone Sodium Phosphate Inj 100 MG/10ML: INTRAMUSCULAR | Qty: 1 | Status: AC

## 2021-03-22 NOTE — Assessment & Plan Note (Signed)
Screening mammogram detected 2 asymmetric areas with calcifications detected in March 2022. October 2022 mammogram revealed left breast calcifications 1.8 cm at 1 o'clock position and a mass below the skin measuring 4 mm. 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

## 2021-03-23 ENCOUNTER — Encounter: Payer: Self-pay | Admitting: *Deleted

## 2021-03-23 ENCOUNTER — Inpatient Hospital Stay: Payer: Managed Care, Other (non HMO) | Admitting: Hematology and Oncology

## 2021-03-23 ENCOUNTER — Other Ambulatory Visit: Payer: Self-pay

## 2021-03-23 ENCOUNTER — Inpatient Hospital Stay: Payer: Managed Care, Other (non HMO) | Attending: Hematology and Oncology

## 2021-03-23 ENCOUNTER — Inpatient Hospital Stay: Payer: Managed Care, Other (non HMO)

## 2021-03-23 VITALS — BP 125/79 | HR 64 | Temp 98.3°F | Resp 18

## 2021-03-23 DIAGNOSIS — Z5111 Encounter for antineoplastic chemotherapy: Secondary | ICD-10-CM | POA: Diagnosis not present

## 2021-03-23 DIAGNOSIS — Z171 Estrogen receptor negative status [ER-]: Secondary | ICD-10-CM

## 2021-03-23 DIAGNOSIS — Z79899 Other long term (current) drug therapy: Secondary | ICD-10-CM | POA: Insufficient documentation

## 2021-03-23 DIAGNOSIS — C50412 Malignant neoplasm of upper-outer quadrant of left female breast: Secondary | ICD-10-CM

## 2021-03-23 DIAGNOSIS — Z95828 Presence of other vascular implants and grafts: Secondary | ICD-10-CM | POA: Insufficient documentation

## 2021-03-23 DIAGNOSIS — Z7952 Long term (current) use of systemic steroids: Secondary | ICD-10-CM | POA: Insufficient documentation

## 2021-03-23 LAB — COMPREHENSIVE METABOLIC PANEL
ALT: 12 U/L (ref 0–44)
AST: 15 U/L (ref 15–41)
Albumin: 4.6 g/dL (ref 3.5–5.0)
Alkaline Phosphatase: 60 U/L (ref 38–126)
Anion gap: 7 (ref 5–15)
BUN: 9 mg/dL (ref 8–23)
CO2: 29 mmol/L (ref 22–32)
Calcium: 10 mg/dL (ref 8.9–10.3)
Chloride: 101 mmol/L (ref 98–111)
Creatinine, Ser: 0.71 mg/dL (ref 0.44–1.00)
GFR, Estimated: >60 mL/min (ref >60)
Glucose, Bld: 91 mg/dL (ref 70–99)
Potassium: 3.6 mmol/L (ref 3.5–5.1)
Sodium: 137 mmol/L (ref 135–145)
Total Bilirubin: 0.7 mg/dL (ref 0.3–1.2)
Total Protein: 7.2 g/dL (ref 6.5–8.1)

## 2021-03-23 LAB — CBC WITH DIFFERENTIAL/PLATELET
Abs Immature Granulocytes: 0.02 10*3/uL (ref 0.00–0.07)
Basophils Absolute: 0 10*3/uL (ref 0.0–0.1)
Basophils Relative: 0 %
Eosinophils Absolute: 0 10*3/uL (ref 0.0–0.5)
Eosinophils Relative: 0 %
HCT: 38.4 % (ref 36.0–46.0)
Hemoglobin: 13.2 g/dL (ref 12.0–15.0)
Immature Granulocytes: 0 %
Lymphocytes Relative: 16 %
Lymphs Abs: 1.4 10*3/uL (ref 0.7–4.0)
MCH: 29.2 pg (ref 26.0–34.0)
MCHC: 34.4 g/dL (ref 30.0–36.0)
MCV: 85 fL (ref 80.0–100.0)
Monocytes Absolute: 0.5 10*3/uL (ref 0.1–1.0)
Monocytes Relative: 6 %
Neutro Abs: 7.2 10*3/uL (ref 1.7–7.7)
Neutrophils Relative %: 78 %
Platelets: 254 10*3/uL (ref 150–400)
RBC: 4.52 MIL/uL (ref 3.87–5.11)
RDW: 13 % (ref 11.5–15.5)
WBC: 9.2 10*3/uL (ref 4.0–10.5)
nRBC: 0 % (ref 0.0–0.2)

## 2021-03-23 MED ORDER — SODIUM CHLORIDE 0.9 % IV SOLN: Freq: Once | INTRAVENOUS | Status: AC

## 2021-03-23 MED ORDER — SODIUM CHLORIDE 0.9% FLUSH
10.0000 mL | INTRAVENOUS | Status: DC | PRN
  Administered 2021-03-23: 10 mL

## 2021-03-23 MED ORDER — PALONOSETRON HCL INJECTION 0.25 MG/5ML
0.2500 mg | Freq: Once | INTRAVENOUS | Status: AC
  Administered 2021-03-23: 0.25 mg via INTRAVENOUS
  Filled 2021-03-23: qty 5

## 2021-03-23 MED ORDER — SODIUM CHLORIDE 0.9 % IV SOLN
10.0000 mg | Freq: Once | INTRAVENOUS | Status: AC
  Administered 2021-03-23: 10 mg via INTRAVENOUS
  Filled 2021-03-23: qty 10

## 2021-03-23 MED ORDER — SODIUM CHLORIDE 0.9% FLUSH
10.0000 mL | Freq: Once | INTRAVENOUS | Status: AC
  Administered 2021-03-23: 10 mL

## 2021-03-23 MED ORDER — SODIUM CHLORIDE 0.9 % IV SOLN
600.0000 mg/m2 | Freq: Once | INTRAVENOUS | Status: AC
  Administered 2021-03-23: 980 mg via INTRAVENOUS
  Filled 2021-03-23: qty 49

## 2021-03-23 MED ORDER — SODIUM CHLORIDE 0.9 % IV SOLN
75.0000 mg/m2 | Freq: Once | INTRAVENOUS | Status: AC
  Administered 2021-03-23: 120 mg via INTRAVENOUS
  Filled 2021-03-23: qty 12

## 2021-03-23 MED ORDER — HEPARIN SOD (PORK) LOCK FLUSH 100 UNIT/ML IV SOLN
500.0000 [IU] | Freq: Once | INTRAVENOUS | Status: AC | PRN
  Administered 2021-03-23: 500 [IU]

## 2021-03-23 NOTE — Research (Signed)
Trial:  DCP-001: Use of a Clinical Trial Screening Tool to Address Cancer Health Disparities in the [place] Program [facility]) Patient Leah Chan was identified as a potential candidate for the above listed study.  This Clinical Research Nurse met with Leah Chan, [phone], on 03/23/21 in a manner and location that ensures patient privacy to discuss participation in the above listed research study.  Patient is Unaccompanied.  A copy of the informed consent document and separate HIPAA Authorization was provided to the patient.  Patient reads, speaks, and understands [place].   Patient was provided with the business card of this Nurse and encouraged to contact the research team with any questions.  Approximately 10 minutes were spent with the patient reviewing the informed consent documents.  Patient was provided the option of taking informed consent documents home to review and was encouraged to review at their convenience with their support network, including other care providers. Patient took the consent documents home to review.  Research will follow-up with patient next Monday, 1/16, when she sees Dr Lindi Adie for a toxicity check. The plan is to sign consent and complete study then.  Vickii Penna, RN, BSN, CPN Clinical Research Nurse I [phone]  03/23/2021 12:42 PM

## 2021-03-23 NOTE — Progress Notes (Signed)
Patient Care Team: Pcp, No as PCP - General [name], MD as Consulting Physician (General Surgery) [name], MD as Consulting Physician (Hematology and Oncology) Kyung Rudd, MD as Consulting Physician (Radiation Oncology)  DIAGNOSIS:  Encounter Diagnosis  Name Primary?   Malignant neoplasm of upper-outer quadrant of left breast in female, estrogen receptor negative ([place])     SUMMARY OF ONCOLOGIC HISTORY: Oncology History  Malignant neoplasm of upper-outer quadrant of left breast in female, estrogen receptor negative ([place])  01/20/2021 Initial Diagnosis   Screening mammogram detected 2 asymmetric areas with calcifications detected in March 2022.  October 2022 mammogram revealed left breast calcifications 1.8 cm at 1 o'clock position and a mass below the skin measuring 4 mm.  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 The report dates are January 29, 2021 and February 09, 2021.   The BRCAplus panel offered by [employer] and includes sequencing and deletion/duplication analysis for the following 8 genes: ATM, BRCA1, BRCA2, CDH1, CHEK2, PALB2, PTEN, and TP53.  The CustomNext-Cancer+RNAinsight panel offered by [facility] includes sequencing and rearrangement analysis for the following 52 genes:  APC, ATM, AXIN2, BAP1, BARD1, BMPR1A, BRCA1, BRCA2, BRIP1, CDH1, CDK4, CDKN2A, CHEK2CTNNA1, DICER1, MEN1, MLH1, MSH2, MSH3, MSH6, MUTYH, NBN, NF1, NTHL1, PALB2, PMS2, POT1, PTCH1, PTEN, RAD50, RAD51C, RAD51D, SDHA, SDHB, SDHC, SDHD, SMAD4, SMARCA4, STK11, SUFU, TP53, TSC1, TSC2 and VHL (sequencing and deletion/duplication); HOXB13, KIT, MITF, PDGFRA, POLD1 and POLE (sequencing only); EPCAM and GREM1 (deletion/duplication only). RNA  data is routinely analyzed for use in variant interpretation for all genes.   02/09/2021 Surgery   Left Lumpectomy: Grade 3 IDC 0.6 cm with DCIS, Ant Margins pos for DCIS, LN Neg, Triple Negative   03/23/2021 -  Chemotherapy   Patient is on Treatment Plan : BREAST TC q21d       CHIEF COMPLIANT:   INTERVAL HISTORY: Leah Chan is a   ALLERGIES:  is allergic to latex and sulfa antibiotics.  MEDICATIONS:  Current Outpatient Medications  Medication Sig Dispense Refill   calcium carbonate (TUMS - DOSED IN MG ELEMENTAL CALCIUM) 500 MG chewable tablet Chew 1 tablet by mouth daily as needed for indigestion or heartburn.     dexamethasone (DECADRON) 4 MG tablet Take 1 tablet (4 mg total) by mouth 2 (two) times daily. Take 1 tablet day before chemo and 1 tablet day after chemo with food 8 tablet 0   escitalopram (LEXAPRO) 10 MG tablet Take 10 mg by mouth daily.     GARLIC PO Take 1 capsule by mouth 4 (four) times a week.     hydrochlorothiazide (HYDRODIURIL) 25 MG tablet Take 50 mg by mouth daily.     hydrOXYzine (ATARAX/VISTARIL) 25 MG tablet Take 50 mg by mouth at bedtime.     lidocaine-prilocaine (EMLA) cream Apply to affected area once 30 g 3   Multiple Minerals (CALCIUM-MAGNESIUM-ZINC) TABS Take 1 tablet by mouth daily.     ondansetron (ZOFRAN) 8 MG tablet Take 1 tablet (8 mg total) by mouth 2 (two) times daily as needed for refractory nausea / vomiting. Start on day 3 after chemo. 30 tablet 1   pravastatin (PRAVACHOL) 10 MG tablet Take 10 mg by mouth at bedtime.     prochlorperazine (COMPAZINE) 10 MG tablet Take 1 tablet (10 mg total) by  mouth every 6 (six) hours as needed for nausea or vomiting. 30 tablet 3   traMADol (ULTRAM) 50 MG tablet Take 1 tablet (50 mg total) by mouth every 6 (six) hours as needed. 25 tablet 0   No current facility-administered medications for this visit.    PHYSICAL EXAMINATION: ECOG PERFORMANCE STATUS: 1 - Symptomatic but completely ambulatory  Vitals:    03/23/21 1114  BP: (!) 147/82  Pulse: 74  Resp: 18  Temp: (!) 97.5 F (36.4 C)  SpO2: 99%   Filed Weights   03/23/21 1114  Weight: 129 lb 4.8 oz (58.7 kg)    BREAST: No palpable masses or nodules in either right or left breasts. No palpable axillary supraclavicular or infraclavicular adenopathy no breast tenderness or nipple discharge. (exam performed in the presence of a chaperone)  LABORATORY DATA:  I have reviewed the data as listed CMP Latest Ref Rng & Units 03/02/2021 01/21/2021  Glucose 70 - 99 mg/dL 85 78  BUN 8 - 23 mg/dL 8 11  Creatinine 0.44 - 1.00 mg/dL 0.85 0.87  Sodium 135 - 145 mmol/L 137 139  Potassium 3.5 - 5.1 mmol/L 3.9 3.7  Chloride 98 - 111 mmol/L 101 101  CO2 22 - 32 mmol/L 29 30  Calcium 8.9 - 10.3 mg/dL 9.5 9.5  Total Protein 6.5 - 8.1 g/dL - 7.5  Total Bilirubin 0.3 - 1.2 mg/dL - 1.1  Alkaline Phos 38 - 126 U/L - 76  AST 15 - 41 U/L - 22  ALT 0 - 44 U/L - 16    Lab Results  Component Value Date   WBC 9.2 03/23/2021   HGB 13.2 03/23/2021   HCT 38.4 03/23/2021   MCV 85.0 03/23/2021   PLT 254 03/23/2021   NEUTROABS 7.2 03/23/2021    ASSESSMENT & PLAN:  Malignant neoplasm of upper-outer quadrant of left breast in female, estrogen receptor negative ([place]) Screening mammogram detected 2 asymmetric areas with calcifications detected in March 2022.  October 2022 mammogram revealed left breast calcifications 1.8 cm at 1 o'clock position and a mass below the skin measuring 4 mm.  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  Adjuvant chemo with Taxotere and Cytoxan every 3 weeks x4 2. adjuvant radiation ------------------------------------------------------------------------------------------------------------------------- Current treatment: Cycle  1 day 1 TC Labs reviewed Chemo consent obtained, chemo edication completed She will receive Neulasta injection through a home health nurse.  Therefore we will cancel the injection appointment.  RTC in 1 week for tox check   No orders of the defined types were placed in this encounter.  The patient has a good understanding of the overall plan. she agrees with it. she will call with any problems that may develop before the next visit here. Total time spent: 30 mins including face to face time and time spent for planning, charting and co-ordination of care   Harriette Ohara, MD 03/23/21

## 2021-03-23 NOTE — Patient Instructions (Addendum)
[facility]   Discharge Instructions: Thank you for choosing [facility] to provide your oncology and hematology care.   If you have a lab appointment with the [facility], please go directly to the [facility] and check in at the registration area.   Wear comfortable clothing and clothing appropriate for easy access to any Portacath or PICC line.   We strive to give you quality time with your provider. You may need to reschedule your appointment if you arrive late (15 or more minutes).  Arriving late affects you and other patients whose appointments are after yours.  Also, if you miss three or more appointments without notifying the office, you may be dismissed from the clinic at the providers discretion.      For prescription refill requests, have your pharmacy contact our office and allow 72 hours for refills to be completed.    Today you received the following chemotherapy and/or immunotherapy agents: docetaxel and cyclophosphamide.    To help prevent nausea and vomiting after your treatment, we encourage you to take your nausea medication as directed.  BELOW ARE SYMPTOMS THAT SHOULD BE REPORTED IMMEDIATELY: *FEVER GREATER THAN 100.4 F (38 C) OR HIGHER *CHILLS OR SWEATING *NAUSEA AND VOMITING THAT IS NOT CONTROLLED WITH YOUR NAUSEA MEDICATION *UNUSUAL SHORTNESS OF BREATH *UNUSUAL BRUISING OR BLEEDING *URINARY PROBLEMS (pain or burning when urinating, or frequent urination) *BOWEL PROBLEMS (unusual diarrhea, constipation, pain near the anus) TENDERNESS IN MOUTH AND THROAT WITH OR WITHOUT PRESENCE OF ULCERS (sore throat, sores in mouth, or a toothache) UNUSUAL RASH, SWELLING OR PAIN  UNUSUAL VAGINAL DISCHARGE OR ITCHING   Items with * indicate a potential emergency and should be followed up as soon as possible or go to the Emergency Department if any problems should occur.  Please show the CHEMOTHERAPY ALERT CARD or IMMUNOTHERAPY  ALERT CARD at check-in to the Emergency Department and triage nurse.  Should you have questions after your visit or need to cancel or reschedule your appointment, please contact [facility]  Dept: [phone]  and follow the prompts.  Office hours are 8:00 a.m. to 4:30 p.m. Monday - Friday. Please note that voicemails left after 4:00 p.m. may not be returned until the following business day.  We are closed weekends and major holidays. You have access to a nurse at all times for urgent questions. Please call the main number to the clinic Dept: [phone] and follow the prompts.   For any non-urgent questions, you may also contact your provider using MyChart. We now offer e-Visits for anyone 40 and older to request care online for non-urgent symptoms. For details visit [email or web address].   Also download the MyChart app! Go to the app store, search "MyChart", open the app, select [facility], and log in with your MyChart username and password.  Due to Covid, a mask is required upon entering the hospital/clinic. If you do not have a mask, one will be given to you upon arrival. For doctor visits, patients may have 1 support person aged 13 or older with them. For treatment visits, patients cannot have anyone with them due to current Covid guidelines and our immunocompromised population.   Docetaxel injection What is this medication? DOCETAXEL (doe se TAX el) is a chemotherapy drug. It targets fast dividing cells, like cancer cells, and causes these cells to die. This medicine is used to treat many types of cancers like breast cancer, certain stomach cancers, head and neck  cancer, lung cancer, and prostate cancer. This medicine may be used for other purposes; ask your health care provider or pharmacist if you have questions. COMMON BRAND NAME(S): Docefrez, Taxotere What should I tell my care team before I take this medication? They need to know if you have any of  these conditions: infection (especially a virus infection such as chickenpox, cold sores, or herpes) liver disease low blood counts, like low white cell, platelet, or red cell counts an unusual or allergic reaction to docetaxel, polysorbate 80, other chemotherapy agents, other medicines, foods, dyes, or preservatives pregnant or trying to get pregnant breast-feeding How should I use this medication? This drug is given as an infusion into a vein. It is administered in a hospital or clinic by a specially trained health care professional. Talk to your pediatrician regarding the use of this medicine in children. Special care may be needed. Overdosage: If you think you have taken too much of this medicine contact a poison control center or emergency room at once. NOTE: This medicine is only for you. Do not share this medicine with others. What if I miss a dose? It is important not to miss your dose. Call your doctor or health care professional if you are unable to keep an appointment. What may interact with this medication? Do not take this medicine with any of the following medications: live virus vaccines This medicine may also interact with the following medications: aprepitant certain antibiotics like erythromycin or clarithromycin certain antivirals for HIV or hepatitis certain medicines for fungal infections like fluconazole, itraconazole, ketoconazole, posaconazole, or voriconazole cimetidine ciprofloxacin conivaptan cyclosporine dronedarone fluvoxamine grapefruit juice imatinib verapamil This list may not describe all possible interactions. Give your health care provider a list of all the medicines, herbs, non-prescription drugs, or dietary supplements you use. Also tell them if you smoke, drink alcohol, or use illegal drugs. Some items may interact with your medicine. What should I watch for while using this medication? Your condition will be monitored carefully while you are  receiving this medicine. You will need important blood work done while you are taking this medicine. Call your doctor or health care professional for advice if you get a fever, chills or sore throat, or other symptoms of a cold or flu. Do not treat yourself. This drug decreases your body's ability to fight infections. Try to avoid being around people who are sick. Some products may contain alcohol. Ask your health care professional if this medicine contains alcohol. Be sure to tell all health care professionals you are taking this medicine. Certain medicines, like metronidazole and disulfiram, can cause an unpleasant reaction when taken with alcohol. The reaction includes flushing, headache, nausea, vomiting, sweating, and increased thirst. The reaction can last from 30 minutes to several hours. You may get drowsy or dizzy. Do not drive, use machinery, or do anything that needs mental alertness until you know how this medicine affects you. Do not stand or sit up quickly, especially if you are an older patient. This reduces the risk of dizzy or fainting spells. Alcohol may interfere with the effect of this medicine. Talk to your health care professional about your risk of cancer. You may be more at risk for certain types of cancer if you take this medicine. Do not become pregnant while taking this medicine or for 6 months after stopping it. Women should inform their doctor if they wish to become pregnant or think they might be pregnant. There is a potential for serious side effects  to an unborn child. Talk to your health care professional or pharmacist for more information. Do not breast-feed an infant while taking this medicine or for 1 week after stopping it. Males who get this medicine must use a condom during sex with females who can get pregnant. If you get a woman pregnant, the baby could have birth defects. The baby could die before they are born. You will need to continue wearing a condom for 3 months  after stopping the medicine. Tell your health care provider right away if your partner becomes pregnant while you are taking this medicine. This may interfere with the ability to father a child. You should talk to your doctor or health care professional if you are concerned about your fertility. What side effects may I notice from receiving this medication? Side effects that you should report to your doctor or health care professional as soon as possible: allergic reactions like skin rash, itching or hives, swelling of the face, lips, or tongue blurred vision breathing problems changes in vision low blood counts - This drug may decrease the number of white blood cells, red blood cells and platelets. You may be at increased risk for infections and bleeding. nausea and vomiting pain, redness or irritation at site where injected pain, tingling, numbness in the hands or feet redness, blistering, peeling, or loosening of the skin, including inside the mouth signs of decreased platelets or bleeding - bruising, pinpoint red spots on the skin, black, tarry stools, nosebleeds signs of decreased red blood cells - unusually weak or tired, fainting spells, lightheadedness signs of infection - fever or chills, cough, sore throat, pain or difficulty passing urine swelling of the ankle, feet, hands Side effects that usually do not require medical attention (report to your doctor or health care professional if they continue or are bothersome): constipation diarrhea fingernail or toenail changes hair loss loss of appetite mouth sores muscle pain This list may not describe all possible side effects. Call your doctor for medical advice about side effects. You may report side effects to FDA at 1-800-FDA-1088. Where should I keep my medication? This drug is given in a hospital or clinic and will not be stored at home. NOTE: This sheet is a summary. It may not cover all possible information. If you have  questions about this medicine, talk to your doctor, pharmacist, or health care provider.  2022 Elsevier/Gold Standard (2020-11-18 00:00:00)   Cyclophosphamide Injection What is this medication? CYCLOPHOSPHAMIDE (sye kloe FOSS fa mide) is a chemotherapy drug. It slows the growth of cancer cells. This medicine is used to treat many types of cancer like lymphoma, myeloma, leukemia, breast cancer, and ovarian cancer, to name a few. This medicine may be used for other purposes; ask your health care provider or pharmacist if you have questions. COMMON BRAND NAME(S): Cytoxan, Neosar What should I tell my care team before I take this medication? They need to know if you have any of these conditions: heart disease history of irregular heartbeat infection kidney disease liver disease low blood counts, like white cells, platelets, or red blood cells on hemodialysis recent or ongoing radiation therapy scarring or thickening of the lungs trouble passing urine an unusual or allergic reaction to cyclophosphamide, other medicines, foods, dyes, or preservatives pregnant or trying to get pregnant breast-feeding How should I use this medication? This drug is usually given as an injection into a vein or muscle or by infusion into a vein. It is administered in a hospital or clinic  by a specially trained health care professional. Talk to your pediatrician regarding the use of this medicine in children. Special care may be needed. Overdosage: If you think you have taken too much of this medicine contact a poison control center or emergency room at once. NOTE: This medicine is only for you. Do not share this medicine with others. What if I miss a dose? It is important not to miss your dose. Call your doctor or health care professional if you are unable to keep an appointment. What may interact with this medication? amphotericin B azathioprine certain antivirals for HIV or hepatitis certain medicines for  blood pressure, heart disease, irregular heart beat certain medicines that treat or prevent blood clots like warfarin certain other medicines for cancer cyclosporine etanercept indomethacin medicines that relax muscles for surgery medicines to increase blood counts metronidazole This list may not describe all possible interactions. Give your health care provider a list of all the medicines, herbs, non-prescription drugs, or dietary supplements you use. Also tell them if you smoke, drink alcohol, or use illegal drugs. Some items may interact with your medicine. What should I watch for while using this medication? Your condition will be monitored carefully while you are receiving this medicine. You may need blood work done while you are taking this medicine. Drink water or other fluids as directed. Urinate often, even at night. Some products may contain alcohol. Ask your health care professional if this medicine contains alcohol. Be sure to tell all health care professionals you are taking this medicine. Certain medicines, like metronidazole and disulfiram, can cause an unpleasant reaction when taken with alcohol. The reaction includes flushing, headache, nausea, vomiting, sweating, and increased thirst. The reaction can last from 30 minutes to several hours. Do not become pregnant while taking this medicine or for 1 year after stopping it. Women should inform their health care professional if they wish to become pregnant or think they might be pregnant. Men should not father a child while taking this medicine and for 4 months after stopping it. There is potential for serious side effects to an unborn child. Talk to your health care professional for more information. Do not breast-feed an infant while taking this medicine or for 1 week after stopping it. This medicine has caused ovarian failure in some women. This medicine may make it more difficult to get pregnant. Talk to your health care  professional if you are concerned about your fertility. This medicine has caused decreased sperm counts in some men. This may make it more difficult to father a child. Talk to your health care professional if you are concerned about your fertility. Call your health care professional for advice if you get a fever, chills, or sore throat, or other symptoms of a cold or flu. Do not treat yourself. This medicine decreases your body's ability to fight infections. Try to avoid being around people who are sick. Avoid taking medicines that contain aspirin, acetaminophen, ibuprofen, naproxen, or ketoprofen unless instructed by your health care professional. These medicines may hide a fever. Talk to your health care professional about your risk of cancer. You may be more at risk for certain types of cancer if you take this medicine. If you are going to need surgery or other procedure, tell your health care professional that you are using this medicine. Be careful brushing or flossing your teeth or using a toothpick because you may get an infection or bleed more easily. If you have any dental work done, tell  your dentist you are receiving this medicine. What side effects may I notice from receiving this medication? Side effects that you should report to your doctor or health care professional as soon as possible: allergic reactions like skin rash, itching or hives, swelling of the face, lips, or tongue breathing problems nausea, vomiting signs and symptoms of bleeding such as bloody or black, tarry stools; red or dark brown urine; spitting up blood or brown material that looks like coffee grounds; red spots on the skin; unusual bruising or bleeding from the eyes, gums, or nose signs and symptoms of heart failure like fast, irregular heartbeat, sudden weight gain; swelling of the ankles, feet, hands signs and symptoms of infection like fever; chills; cough; sore throat; pain or trouble passing urine signs and  symptoms of kidney injury like trouble passing urine or change in the amount of urine signs and symptoms of liver injury like dark yellow or brown urine; general ill feeling or flu-like symptoms; light-colored stools; loss of appetite; nausea; right upper belly pain; unusually weak or tired; yellowing of the eyes or skin Side effects that usually do not require medical attention (report to your doctor or health care professional if they continue or are bothersome): confusion decreased hearing diarrhea facial flushing hair loss headache loss of appetite missed menstrual periods signs and symptoms of low red blood cells or anemia such as unusually weak or tired; feeling faint or lightheaded; falls skin discoloration This list may not describe all possible side effects. Call your doctor for medical advice about side effects. You may report side effects to FDA at 1-800-FDA-1088. Where should I keep my medication? This drug is given in a hospital or clinic and will not be stored at home. NOTE: This sheet is a summary. It may not cover all possible information. If you have questions about this medicine, talk to your doctor, pharmacist, or health care provider.  2022 Elsevier/Gold Standard (2020-11-18 00:00:00)

## 2021-03-24 ENCOUNTER — Telehealth: Payer: Self-pay | Admitting: *Deleted

## 2021-03-24 NOTE — Telephone Encounter (Signed)
-----   Message from Clyda Hurdle, RN sent at 03/23/2021  3:03 PM EST ----- Regarding: 1st Time Taxotere-Cytoxan Dr Lindi Adie Dr Geralyn Flash patient 1st Time Taxotere-Cytoxan

## 2021-03-24 NOTE — Telephone Encounter (Signed)
Called pt to see how she did with her treatment.  She reports doing well & denies any problems.  She reports knowing how to reach [place] if needed & knows her appts.

## 2021-03-25 ENCOUNTER — Encounter: Payer: Self-pay | Admitting: *Deleted

## 2021-03-25 ENCOUNTER — Inpatient Hospital Stay: Payer: Managed Care, Other (non HMO)

## 2021-03-25 NOTE — Progress Notes (Signed)
Received call from [place] with [facility] stating [facility] RN will be going to pts house today to administer Ziextenzo.  Devoney wanted to provide our office with her number if there are any issues that arise in the future [phone]).

## 2021-03-27 NOTE — Progress Notes (Signed)
Patient Care Team: Pcp, No as PCP - General [name], MD as Consulting Physician (General Surgery) [name], MD as Consulting Physician (Hematology and Oncology) Kyung Rudd, MD as Consulting Physician (Radiation Oncology)  DIAGNOSIS:    ICD-10-CM   1. Malignant neoplasm of upper-outer quadrant of left breast in female, estrogen receptor negative ([place])  C50.412    Z17.1       SUMMARY OF ONCOLOGIC HISTORY: Oncology History  Malignant neoplasm of upper-outer quadrant of left breast in female, estrogen receptor negative ([place])  01/20/2021 Initial Diagnosis   Screening mammogram detected 2 asymmetric areas with calcifications detected in March 2022.  October 2022 mammogram revealed left breast calcifications 1.8 cm at 1 o'clock position and a mass below the skin measuring 4 mm.  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 The report dates are January 29, 2021 and February 09, 2021.   The BRCAplus panel offered by [employer] and includes sequencing and deletion/duplication analysis for the following 8 genes: ATM, BRCA1, BRCA2, CDH1, CHEK2, PALB2, PTEN, and TP53.  The CustomNext-Cancer+RNAinsight panel offered by [facility] includes sequencing and rearrangement analysis for the following 52 genes:  APC, ATM, AXIN2, BAP1, BARD1, BMPR1A, BRCA1, BRCA2, BRIP1, CDH1, CDK4, CDKN2A, CHEK2CTNNA1, DICER1, MEN1, MLH1, MSH2, MSH3, MSH6, MUTYH, NBN, NF1, NTHL1, PALB2, PMS2, POT1, PTCH1, PTEN, RAD50, RAD51C, RAD51D, SDHA, SDHB, SDHC, SDHD, SMAD4, SMARCA4, STK11, SUFU, TP53, TSC1, TSC2 and VHL (sequencing and deletion/duplication); HOXB13, KIT, MITF, PDGFRA, POLD1 and POLE (sequencing only); EPCAM and GREM1 (deletion/duplication only). RNA  data is routinely analyzed for use in variant interpretation for all genes.   02/09/2021 Surgery   Left Lumpectomy: Grade 3 IDC 0.6 cm with DCIS, Ant Margins pos for DCIS, LN Neg, Triple Negative   03/23/2021 -  Chemotherapy   Patient is on Treatment Plan : BREAST TC q21d       CHIEF COMPLIANT: Cycle 1 day 8 Taxotere and Cytoxan  INTERVAL HISTORY: Leah Chan is a 61 y.o. with above-mentioned history of malignant neoplasm of UOQ of the left breast.  That she is here for toxicity evaluation on cycle 1 day 8 of treatment with Taxotere and Cytoxan. Overall she tolerated chemo extremely well.  She did not have any nausea or vomiting.  She did feel fatigued.  Her tongue had some discoloration.  Denies any diarrhea constipation denies any fevers or chills.  She is also complaining of pain in the bones especially in the pelvis.  She is taking Claritin for that.  ALLERGIES:  is allergic to latex and sulfa antibiotics.  MEDICATIONS:  Current Outpatient Medications  Medication Sig Dispense Refill   calcium carbonate (TUMS - DOSED IN MG ELEMENTAL CALCIUM) 500 MG chewable tablet Chew 1 tablet by mouth daily as needed for indigestion or heartburn.     dexamethasone (DECADRON) 4 MG tablet Take 1 tablet (4 mg total) by mouth 2 (two) times daily. Take 1 tablet day before chemo and 1 tablet day after chemo with food 8 tablet 0   escitalopram (LEXAPRO) 10 MG tablet Take 10 mg by mouth daily.     GARLIC PO Take 1 capsule by mouth 4 (four) times a week.     hydrochlorothiazide (HYDRODIURIL) 25 MG tablet Take 50 mg by mouth daily.     hydrOXYzine (ATARAX/VISTARIL) 25 MG  tablet Take 50 mg by mouth at bedtime.     lidocaine-prilocaine (EMLA) cream Apply to affected area once 30 g 3   Multiple Minerals (CALCIUM-MAGNESIUM-ZINC) TABS Take 1 tablet by mouth daily.     ondansetron (ZOFRAN) 8 MG tablet Take 1 tablet (8 mg total) by mouth 2 (two) times daily as needed for refractory nausea / vomiting. Start on day  3 after chemo. 30 tablet 1   pravastatin (PRAVACHOL) 10 MG tablet Take 10 mg by mouth at bedtime.     prochlorperazine (COMPAZINE) 10 MG tablet Take 1 tablet (10 mg total) by mouth every 6 (six) hours as needed for nausea or vomiting. 30 tablet 3   traMADol (ULTRAM) 50 MG tablet Take 1 tablet (50 mg total) by mouth every 6 (six) hours as needed. 25 tablet 0   No current facility-administered medications for this visit.    PHYSICAL EXAMINATION: ECOG PERFORMANCE STATUS: 1 - Symptomatic but completely ambulatory  Vitals:   03/30/21 0949  BP: (!) 141/76  Pulse: 95  Resp: 18  Temp: 97.8 F (36.6 C)  SpO2: 99%   Filed Weights   03/30/21 0949  Weight: 129 lb 9.6 oz (58.8 kg)      LABORATORY DATA:  I have reviewed the data as listed CMP Latest Ref Rng & Units 03/23/2021 03/02/2021 01/21/2021  Glucose 70 - 99 mg/dL 91 85 78  BUN 8 - 23 mg/dL '9 8 11  ' Creatinine 0.44 - 1.00 mg/dL 0.71 0.85 0.87  Sodium 135 - 145 mmol/L 137 137 139  Potassium 3.5 - 5.1 mmol/L 3.6 3.9 3.7  Chloride 98 - 111 mmol/L 101 101 101  CO2 22 - 32 mmol/L '29 29 30  ' Calcium 8.9 - 10.3 mg/dL 10.0 9.5 9.5  Total Protein 6.5 - 8.1 g/dL 7.2 - 7.5  Total Bilirubin 0.3 - 1.2 mg/dL 0.7 - 1.1  Alkaline Phos 38 - 126 U/L 60 - 76  AST 15 - 41 U/L 15 - 22  ALT 0 - 44 U/L 12 - 16    Lab Results  Component Value Date   WBC 3.9 (L) 03/30/2021   HGB 12.6 03/30/2021   HCT 36.4 03/30/2021   MCV 84.7 03/30/2021   PLT 144 (L) 03/30/2021   NEUTROABS PENDING 03/30/2021    ASSESSMENT & PLAN:  Malignant neoplasm of upper-outer quadrant of left breast in female, estrogen receptor negative ([place]) Screening mammogram detected 2 asymmetric areas with calcifications detected in March 2022.  October 2022 mammogram revealed left breast calcifications 1.8 cm at 1 o'clock position and a mass below the skin measuring 4 mm.  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  Adjuvant chemo with Taxotere and Cytoxan every 3 weeks x4 2. adjuvant radiation ------------------------------------------------------------------------------------------------------------------------- Current treatment: Cycle 1 day 8 TC Chemo Toxicities: Pelvic bone pain: Instructed her to take Tylenol along with Claritin Fatigue as expected  Monitoring blood counts closely RTC in 2 weeks for cycle 2    No orders of the defined types were placed in this encounter.  The patient has a good understanding of the overall plan. she agrees with it. she will call with any problems that may develop before the next visit here.  Total time spent: 30 mins including face to face time and time spent  for planning, charting and coordination of care  Rulon Eisenmenger, MD, MPH 03/30/2021  I, Thana Ates, am acting as scribe for Dr. [name].  I have reviewed the above documentation for accuracy and completeness, and I agree with the above.

## 2021-03-29 NOTE — Assessment & Plan Note (Signed)
Screening mammogram detected 2 asymmetric areas with calcifications detected in March 2022. October 2022 mammogram revealed left breast calcifications 1.8 cm at 1 o'clock position and a mass below the skin measuring 4 mm. 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

## 2021-03-30 ENCOUNTER — Encounter: Payer: Self-pay | Admitting: *Deleted

## 2021-03-30 ENCOUNTER — Other Ambulatory Visit: Payer: Self-pay

## 2021-03-30 ENCOUNTER — Other Ambulatory Visit: Payer: Managed Care, Other (non HMO)

## 2021-03-30 ENCOUNTER — Other Ambulatory Visit: Payer: Self-pay | Admitting: Hematology and Oncology

## 2021-03-30 ENCOUNTER — Ambulatory Visit: Payer: Managed Care, Other (non HMO)

## 2021-03-30 ENCOUNTER — Inpatient Hospital Stay: Payer: Managed Care, Other (non HMO)

## 2021-03-30 ENCOUNTER — Inpatient Hospital Stay (HOSPITAL_BASED_OUTPATIENT_CLINIC_OR_DEPARTMENT_OTHER): Payer: Managed Care, Other (non HMO) | Admitting: Hematology and Oncology

## 2021-03-30 ENCOUNTER — Ambulatory Visit: Payer: Managed Care, Other (non HMO) | Admitting: Hematology and Oncology

## 2021-03-30 DIAGNOSIS — Z95828 Presence of other vascular implants and grafts: Secondary | ICD-10-CM

## 2021-03-30 DIAGNOSIS — Z171 Estrogen receptor negative status [ER-]: Secondary | ICD-10-CM

## 2021-03-30 DIAGNOSIS — Z5111 Encounter for antineoplastic chemotherapy: Secondary | ICD-10-CM | POA: Diagnosis not present

## 2021-03-30 DIAGNOSIS — C50412 Malignant neoplasm of upper-outer quadrant of left female breast: Secondary | ICD-10-CM

## 2021-03-30 LAB — CBC WITH DIFFERENTIAL/PLATELET
Abs Immature Granulocytes: 0.1 10*3/uL — ABNORMAL HIGH (ref 0.00–0.07)
Band Neutrophils: 11 %
Basophils Absolute: 0 10*3/uL (ref 0.0–0.1)
Basophils Relative: 1 %
Eosinophils Absolute: 0 10*3/uL (ref 0.0–0.5)
Eosinophils Relative: 1 %
HCT: 36.4 % (ref 36.0–46.0)
Hemoglobin: 12.6 g/dL (ref 12.0–15.0)
Lymphocytes Relative: 62 %
Lymphs Abs: 2.4 10*3/uL (ref 0.7–4.0)
MCH: 29.3 pg (ref 26.0–34.0)
MCHC: 34.6 g/dL (ref 30.0–36.0)
MCV: 84.7 fL (ref 80.0–100.0)
Metamyelocytes Relative: 2 %
Monocytes Absolute: 0.6 10*3/uL (ref 0.1–1.0)
Monocytes Relative: 16 %
Myelocytes: 1 %
Neutro Abs: 0.7 10*3/uL — ABNORMAL LOW (ref 1.7–7.7)
Neutrophils Relative %: 6 %
Platelets: 144 10*3/uL — ABNORMAL LOW (ref 150–400)
RBC: 4.3 MIL/uL (ref 3.87–5.11)
RDW: 12.4 % (ref 11.5–15.5)
WBC: 3.9 10*3/uL — ABNORMAL LOW (ref 4.0–10.5)
nRBC: 1 % — ABNORMAL HIGH (ref 0.0–0.2)

## 2021-03-30 LAB — COMPREHENSIVE METABOLIC PANEL
ALT: 20 U/L (ref 0–44)
AST: 16 U/L (ref 15–41)
Albumin: 4.2 g/dL (ref 3.5–5.0)
Alkaline Phosphatase: 73 U/L (ref 38–126)
Anion gap: 11 (ref 5–15)
BUN: 11 mg/dL (ref 8–23)
CO2: 30 mmol/L (ref 22–32)
Calcium: 9.5 mg/dL (ref 8.9–10.3)
Chloride: 96 mmol/L — ABNORMAL LOW (ref 98–111)
Creatinine, Ser: 0.86 mg/dL (ref 0.44–1.00)
GFR, Estimated: >60 mL/min (ref >60)
Glucose, Bld: 151 mg/dL — ABNORMAL HIGH (ref 70–99)
Potassium: 3.2 mmol/L — ABNORMAL LOW (ref 3.5–5.1)
Sodium: 137 mmol/L (ref 135–145)
Total Bilirubin: 1.1 mg/dL (ref 0.3–1.2)
Total Protein: 6.8 g/dL (ref 6.5–8.1)

## 2021-03-30 MED ORDER — SODIUM CHLORIDE 0.9% FLUSH
10.0000 mL | Freq: Once | INTRAVENOUS | Status: AC
  Administered 2021-03-30: 10 mL

## 2021-03-30 MED ORDER — HEPARIN SOD (PORK) LOCK FLUSH 100 UNIT/ML IV SOLN
500.0000 [IU] | Freq: Once | INTRAVENOUS | Status: AC
  Administered 2021-03-30: 500 [IU] via INTRAVENOUS

## 2021-03-30 NOTE — Research (Signed)
Trial: DCP-001 Use of a Clinical Trial Screening Tool to Address Cancer Health Disparities in the [facility] Program: Patient Leah Chan was identified by research nurse as a potential candidate for the above listed study.  This Clinical Research Nurse met with Harless Nakayama, [phone], on 03/30/21 in a manner and location that ensures patient privacy to discuss participation in the above listed research study.  Patient is Unaccompanied.  A copy of the informed consent document and separate HIPAA Authorization was provided to the patient.  Patient reads, speaks, and understands [place].    Patient was provided with the business card of this Nurse and encouraged to contact the research team with any questions.  Patient was provided the option of taking informed consent documents home to review and was encouraged to review at their convenience with their support network, including other care providers. Patient is comfortable with making a decision regarding study participation today.  As outlined in the informed consent form, this Nurse and Harless Nakayama discussed the purpose of the research study, the investigational nature of the study, study procedures and requirements for study participation, potential risks and benefits of study participation, as well as alternatives to participation.  Confidentiality and how the patient's information will be used as part of study participation were discussed.  Patient was informed there is not reimbursement provided for their time and effort spent on trial participation.  All questions were answered to patient's satisfaction.  The informed consent and separate HIPAA Authorization was reviewed page by page.  The patient's mental and emotional status is appropriate to provide informed consent, and the patient verbalizes an understanding of study participation.  Patient has agreed to participate in the above listed research study and has  voluntarily signed the informed consent PVD 09/05/18, [place] Active 12/12/20 and separate HIPAA Authorization, version 5, [place] approved 01/07/21  on 03/30/21 at [place].  The patient was provided with a copy of the signed informed consent form and separate HIPAA Authorization for their reference.  No study specific procedures were obtained prior to the signing of the informed consent document.  Approximately 15 minutes were spent with the patient reviewing the informed consent documents.  Patient was not requested to complete a Release of Information form.   After consents/hippa signed, this research nurse spent 5 minutes asking patient demographic information that likely cannot be found in her chart. Thanked patient for her participation in this study and encouraged her to call if any questions after she leaves today.  Patient meets all the inclusion criteria and none of the exclusion criteria for enrollment on this study. Patient consent and hippa forms along with completed DCP-001 worksheet place in the research assistant/specialist to do basket for enrollment into this study.  Foye Spurling, BSN, RN, [employer] Clinical Research Nurse 03/30/2021 10:37 AM

## 2021-03-31 ENCOUNTER — Telehealth: Payer: Self-pay

## 2021-03-31 NOTE — Telephone Encounter (Signed)
Called pt, per Mendel Ryder, instructed pt to increase her potassium intake as levels were slightly low.  Pt verbalized understanding and thanks

## 2021-03-31 NOTE — Telephone Encounter (Signed)
-----   Message from Gardenia Phlegm, NP sent at 03/30/2021  1:38 PM EST ----- Patient with slightly decreased potassium level.  Please call her and recommend potassium in her diet. ----- Message ----- From: Interface, Lab In [place] Sent: 03/30/2021   9:53 AM EST To: [name], MD

## 2021-04-01 ENCOUNTER — Ambulatory Visit: Payer: Managed Care, Other (non HMO)

## 2021-04-06 ENCOUNTER — Other Ambulatory Visit: Payer: Managed Care, Other (non HMO)

## 2021-04-06 ENCOUNTER — Ambulatory Visit: Payer: Managed Care, Other (non HMO) | Admitting: Hematology and Oncology

## 2021-04-06 ENCOUNTER — Ambulatory Visit: Payer: Managed Care, Other (non HMO)

## 2021-04-08 ENCOUNTER — Ambulatory Visit: Payer: Managed Care, Other (non HMO)

## 2021-04-10 MED FILL — Dexamethasone Sodium Phosphate Inj 100 MG/10ML: INTRAMUSCULAR | Qty: 1 | Status: AC

## 2021-04-11 NOTE — Progress Notes (Signed)
Patient Care Team: Pcp, No as PCP - General Leah Miyamoto, MD as Consulting Physician (General Surgery) Leah Croissant, MD as Consulting Physician (Hematology and Oncology) Leah Puffer, MD as Consulting Physician (Radiation Oncology)  DIAGNOSIS:    ICD-10-CM   1. Malignant neoplasm of upper-outer quadrant of left breast in female, estrogen receptor negative ([facility])  C50.412    Z17.1       SUMMARY OF ONCOLOGIC HISTORY: Oncology History  Malignant neoplasm of upper-outer quadrant of left breast in female, estrogen receptor negative ([facility])  01/20/2021 Initial Diagnosis   Screening mammogram detected 2 asymmetric areas with calcifications detected in March 2022.  October 2022 mammogram revealed left breast calcifications 1.8 cm at 1 o'clock position and a mass below the skin measuring 4 mm.  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 The report dates are January 29, 2021 and February 09, 2021.   The BRCAplus panel offered by [employer] and includes sequencing and deletion/duplication analysis for the following 8 genes: ATM, BRCA1, BRCA2, CDH1, CHEK2, PALB2, PTEN, and TP53.  The CustomNext-Cancer+RNAinsight panel offered by [facility] includes sequencing and rearrangement analysis for the following 52 genes:  APC, ATM, AXIN2, BAP1, BARD1, BMPR1A, BRCA1, BRCA2, BRIP1, CDH1, CDK4, CDKN2A, CHEK2CTNNA1, DICER1, MEN1, MLH1, MSH2, MSH3, MSH6, MUTYH, NBN, NF1, NTHL1, PALB2, PMS2, POT1, PTCH1, PTEN, RAD50, RAD51C, RAD51D, SDHA, SDHB, SDHC, SDHD, SMAD4, SMARCA4, STK11, SUFU, TP53, TSC1, TSC2 and VHL (sequencing and deletion/duplication); HOXB13, KIT, MITF, PDGFRA, POLD1 and POLE (sequencing only); EPCAM and GREM1 (deletion/duplication only). RNA  data is routinely analyzed for use in variant interpretation for all genes.   02/09/2021 Surgery   Left Lumpectomy: Grade 3 IDC 0.6 cm with DCIS, Ant Margins pos for DCIS, LN Neg, Triple Negative   03/23/2021 -  Chemotherapy   Patient is on Treatment Plan : BREAST TC q21d       CHIEF COMPLIANT: Cycle 2 Taxotere and Cytoxan  INTERVAL HISTORY: Leah Chan is a 61 y.o. with above-mentioned history of malignant neoplasm of UOQ of the left breast currently on chemotherapy with TC. She presents to the clinic today for treatment.  Her major complaint is fatigue but otherwise she has done very well from chemo standpoint.  Denies any nausea or vomiting.  ALLERGIES:  is allergic to latex and sulfa antibiotics.  MEDICATIONS:  Current Outpatient Medications  Medication Sig Dispense Refill   calcium carbonate (TUMS - DOSED IN MG ELEMENTAL CALCIUM) 500 MG chewable tablet Chew 1 tablet by mouth daily as needed for indigestion or heartburn.     dexamethasone (DECADRON) 4 MG tablet Take 1 tablet (4 mg total) by mouth 2 (two) times daily. Take 1 tablet day before chemo and 1 tablet day after chemo with food 8 tablet 0   escitalopram (LEXAPRO) 10 MG tablet Take 10 mg by mouth daily.     GARLIC PO Take 1 capsule by mouth 4 (four) times a week.     hydrochlorothiazide (HYDRODIURIL) 25 MG tablet Take 50 mg by mouth daily.     hydrOXYzine (ATARAX/VISTARIL) 25 MG tablet Take 50 mg by mouth at bedtime.     lidocaine-prilocaine (EMLA) cream Apply to affected area once 30 g 3   Multiple Minerals (CALCIUM-MAGNESIUM-ZINC) TABS Take 1 tablet by mouth daily.     ondansetron (ZOFRAN) 8 MG  tablet Take 1 tablet (8 mg total) by mouth 2 (two) times daily as needed for refractory nausea / vomiting. Start on day 3 after chemo. 30 tablet 1   pravastatin (PRAVACHOL) 10 MG tablet Take 10 mg by mouth at bedtime.     prochlorperazine (COMPAZINE) 10 MG tablet Take 1 tablet (10 mg total) by mouth every 6 (six) hours as needed for  nausea or vomiting. 30 tablet 3   traMADol (ULTRAM) 50 MG tablet Take 1 tablet (50 mg total) by mouth every 6 (six) hours as needed. 25 tablet 0   No current facility-administered medications for this visit.    PHYSICAL EXAMINATION: ECOG PERFORMANCE STATUS: 1 - Symptomatic but completely ambulatory  Vitals:   04/13/21 1108  BP: (!) 146/89  Pulse: 76  Resp: 18  Temp: 97.7 F (36.5 C)  SpO2: 100%   Filed Weights   04/13/21 1108  Weight: 130 lb 9 oz (59.2 kg)     LABORATORY DATA:  I have reviewed the data as listed CMP Latest Ref Rng & Units 03/30/2021 03/23/2021 03/02/2021  Glucose 70 - 99 mg/dL 151(H) 91 85  BUN 8 - 23 mg/dL $Remove'11 9 8  'lpJQQQw$ Creatinine 0.44 - 1.00 mg/dL 0.86 0.71 0.85  Sodium 135 - 145 mmol/L 137 137 137  Potassium 3.5 - 5.1 mmol/L 3.2(L) 3.6 3.9  Chloride 98 - 111 mmol/L 96(L) 101 101  CO2 22 - 32 mmol/L $RemoveB'30 29 29  'kXtxuYaZ$ Calcium 8.9 - 10.3 mg/dL 9.5 10.0 9.5  Total Protein 6.5 - 8.1 g/dL 6.8 7.2 -  Total Bilirubin 0.3 - 1.2 mg/dL 1.1 0.7 -  Alkaline Phos 38 - 126 U/L 73 60 -  AST 15 - 41 U/L 16 15 -  ALT 0 - 44 U/L 20 12 -    Lab Results  Component Value Date   WBC 12.5 (H) 04/13/2021   HGB 11.7 (L) 04/13/2021   HCT 34.3 (L) 04/13/2021   MCV 85.5 04/13/2021   PLT 403 (H) 04/13/2021   NEUTROABS 9.9 (H) 04/13/2021    ASSESSMENT & PLAN:  Malignant neoplasm of upper-outer quadrant of left breast in female, estrogen receptor negative ([place]) Screening mammogram detected 2 asymmetric areas with calcifications detected in March 2022.  October 2022 mammogram revealed left breast calcifications 1.8 cm at 1 o'clock position and a mass below the skin measuring 4 mm.  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  Adjuvant chemo with Taxotere and Cytoxan every 3 weeks  x4 2. adjuvant radiation ------------------------------------------------------------------------------------------------------------------------- Current treatment: Cycle 2 TC Chemo Toxicities: Pelvic bone pain: Instructed her to take Tylenol along with Claritin Fatigue as expected Chemo induced anemia: Mild   Monitoring blood counts closely RTC in 3 weeks for cycle 3    No orders of the defined types were placed in this encounter.  The patient has a good understanding of the overall plan. she agrees with it. she will call with any problems that may develop before the next visit here.  Total time spent: 30 mins including face to face time and time spent for planning, charting and coordination of care  Rulon Eisenmenger, MD, MPH 04/13/2021  I, Thana Ates, am acting as scribe for Dr. [name].  I have reviewed the above documentation for accuracy and completeness, and  I agree with the above.

## 2021-04-13 ENCOUNTER — Inpatient Hospital Stay: Payer: Managed Care, Other (non HMO)

## 2021-04-13 ENCOUNTER — Inpatient Hospital Stay: Payer: Managed Care, Other (non HMO) | Admitting: Hematology and Oncology

## 2021-04-13 ENCOUNTER — Other Ambulatory Visit: Payer: Self-pay

## 2021-04-13 DIAGNOSIS — Z5111 Encounter for antineoplastic chemotherapy: Secondary | ICD-10-CM | POA: Diagnosis not present

## 2021-04-13 DIAGNOSIS — Z171 Estrogen receptor negative status [ER-]: Secondary | ICD-10-CM | POA: Diagnosis not present

## 2021-04-13 DIAGNOSIS — C50412 Malignant neoplasm of upper-outer quadrant of left female breast: Secondary | ICD-10-CM

## 2021-04-13 DIAGNOSIS — Z95828 Presence of other vascular implants and grafts: Secondary | ICD-10-CM

## 2021-04-13 LAB — CBC WITH DIFFERENTIAL/PLATELET
Abs Immature Granulocytes: 0.04 10*3/uL (ref 0.00–0.07)
Basophils Absolute: 0.1 10*3/uL (ref 0.0–0.1)
Basophils Relative: 1 %
Eosinophils Absolute: 0 10*3/uL (ref 0.0–0.5)
Eosinophils Relative: 0 %
HCT: 34.3 % — ABNORMAL LOW (ref 36.0–46.0)
Hemoglobin: 11.7 g/dL — ABNORMAL LOW (ref 12.0–15.0)
Immature Granulocytes: 0 %
Lymphocytes Relative: 11 %
Lymphs Abs: 1.3 10*3/uL (ref 0.7–4.0)
MCH: 29.2 pg (ref 26.0–34.0)
MCHC: 34.1 g/dL (ref 30.0–36.0)
MCV: 85.5 fL (ref 80.0–100.0)
Monocytes Absolute: 1.1 10*3/uL — ABNORMAL HIGH (ref 0.1–1.0)
Monocytes Relative: 9 %
Neutro Abs: 9.9 10*3/uL — ABNORMAL HIGH (ref 1.7–7.7)
Neutrophils Relative %: 79 %
Platelets: 403 10*3/uL — ABNORMAL HIGH (ref 150–400)
RBC: 4.01 MIL/uL (ref 3.87–5.11)
RDW: 14.4 % (ref 11.5–15.5)
WBC: 12.5 10*3/uL — ABNORMAL HIGH (ref 4.0–10.5)
nRBC: 0 % (ref 0.0–0.2)

## 2021-04-13 LAB — COMPREHENSIVE METABOLIC PANEL
ALT: 25 U/L (ref 0–44)
AST: 19 U/L (ref 15–41)
Albumin: 4.3 g/dL (ref 3.5–5.0)
Alkaline Phosphatase: 65 U/L (ref 38–126)
Anion gap: 8 (ref 5–15)
BUN: 10 mg/dL (ref 8–23)
CO2: 28 mmol/L (ref 22–32)
Calcium: 9.7 mg/dL (ref 8.9–10.3)
Chloride: 102 mmol/L (ref 98–111)
Creatinine, Ser: 0.72 mg/dL (ref 0.44–1.00)
GFR, Estimated: >60 mL/min (ref >60)
Glucose, Bld: 115 mg/dL — ABNORMAL HIGH (ref 70–99)
Potassium: 3.5 mmol/L (ref 3.5–5.1)
Sodium: 138 mmol/L (ref 135–145)
Total Bilirubin: 0.7 mg/dL (ref 0.3–1.2)
Total Protein: 6.7 g/dL (ref 6.5–8.1)

## 2021-04-13 MED ORDER — SODIUM CHLORIDE 0.9 % IV SOLN: Freq: Once | INTRAVENOUS | Status: AC

## 2021-04-13 MED ORDER — SODIUM CHLORIDE 0.9% FLUSH
10.0000 mL | INTRAVENOUS | Status: DC | PRN
  Administered 2021-04-13: 10 mL

## 2021-04-13 MED ORDER — PALONOSETRON HCL INJECTION 0.25 MG/5ML
0.2500 mg | Freq: Once | INTRAVENOUS | Status: AC
  Administered 2021-04-13: 0.25 mg via INTRAVENOUS
  Filled 2021-04-13: qty 5

## 2021-04-13 MED ORDER — SODIUM CHLORIDE 0.9 % IV SOLN
75.0000 mg/m2 | Freq: Once | INTRAVENOUS | Status: AC
  Administered 2021-04-13: 120 mg via INTRAVENOUS
  Filled 2021-04-13: qty 12

## 2021-04-13 MED ORDER — SODIUM CHLORIDE 0.9 % IV SOLN
10.0000 mg | Freq: Once | INTRAVENOUS | Status: AC
  Administered 2021-04-13: 10 mg via INTRAVENOUS
  Filled 2021-04-13: qty 10

## 2021-04-13 MED ORDER — HEPARIN SOD (PORK) LOCK FLUSH 100 UNIT/ML IV SOLN
500.0000 [IU] | Freq: Once | INTRAVENOUS | Status: AC | PRN
  Administered 2021-04-13: 500 [IU]

## 2021-04-13 MED ORDER — SODIUM CHLORIDE 0.9% FLUSH
10.0000 mL | Freq: Once | INTRAVENOUS | Status: AC
  Administered 2021-04-13: 10 mL

## 2021-04-13 MED ORDER — SODIUM CHLORIDE 0.9 % IV SOLN
600.0000 mg/m2 | Freq: Once | INTRAVENOUS | Status: AC
  Administered 2021-04-13: 980 mg via INTRAVENOUS
  Filled 2021-04-13: qty 49

## 2021-04-13 NOTE — Progress Notes (Signed)
Pt informed RN that she does compression therapy during Docetaxel infusion.  RN provided pt w/ 2 pairs of size small gloves. Pt applied them prior to the start of the Docetaxel infusion. Pt also informed RN that she wore compression socks to her appt today.

## 2021-04-13 NOTE — Patient Instructions (Signed)
[facility]  Discharge Instructions: Thank you for choosing [facility] to provide your oncology and hematology care.   If you have a lab appointment with the [facility], please go directly to the [facility] and check in at the registration area.   Wear comfortable clothing and clothing appropriate for easy access to any Portacath or PICC line.   We strive to give you quality time with your provider. You may need to reschedule your appointment if you arrive late (15 or more minutes).  Arriving late affects you and other patients whose appointments are after yours.  Also, if you miss three or more appointments without notifying the office, you may be dismissed from the clinic at the providers discretion.      For prescription refill requests, have your pharmacy contact our office and allow 72 hours for refills to be completed.    Today you received the following chemotherapy and/or immunotherapy agents: Docetaxel & Cytoxan    To help prevent nausea and vomiting after your treatment, we encourage you to take your nausea medication as directed.  BELOW ARE SYMPTOMS THAT SHOULD BE REPORTED IMMEDIATELY: *FEVER GREATER THAN 100.4 F (38 C) OR HIGHER *CHILLS OR SWEATING *NAUSEA AND VOMITING THAT IS NOT CONTROLLED WITH YOUR NAUSEA MEDICATION *UNUSUAL SHORTNESS OF BREATH *UNUSUAL BRUISING OR BLEEDING *URINARY PROBLEMS (pain or burning when urinating, or frequent urination) *BOWEL PROBLEMS (unusual diarrhea, constipation, pain near the anus) TENDERNESS IN MOUTH AND THROAT WITH OR WITHOUT PRESENCE OF ULCERS (sore throat, sores in mouth, or a toothache) UNUSUAL RASH, SWELLING OR PAIN  UNUSUAL VAGINAL DISCHARGE OR ITCHING   Items with * indicate a potential emergency and should be followed up as soon as possible or go to the Emergency Department if any problems should occur.  Please show the CHEMOTHERAPY ALERT CARD or IMMUNOTHERAPY ALERT CARD at  check-in to the Emergency Department and triage nurse.  Should you have questions after your visit or need to cancel or reschedule your appointment, please contact [facility]  Dept: [phone]  and follow the prompts.  Office hours are 8:00 a.m. to 4:30 p.m. Monday - Friday. Please note that voicemails left after 4:00 p.m. may not be returned until the following business day.  We are closed weekends and major holidays. You have access to a nurse at all times for urgent questions. Please call the main number to the clinic Dept: [phone] and follow the prompts.   For any non-urgent questions, you may also contact your provider using MyChart. We now offer e-Visits for anyone 55 and older to request care online for non-urgent symptoms. For details visit [email or web address].   Also download the MyChart app! Go to the app store, search "MyChart", open the app, select [facility], and log in with your MyChart username and password.  Due to Covid, a mask is required upon entering the hospital/clinic. If you do not have a mask, one will be given to you upon arrival. For doctor visits, patients may have 1 support person aged 60 or older with them. For treatment visits, patients cannot have anyone with them due to current Covid guidelines and our immunocompromised population.

## 2021-04-13 NOTE — Assessment & Plan Note (Signed)
Screening mammogram detected 2 asymmetric areas with calcifications detected in March 2022. October 2022 mammogram revealed left breast calcifications 1.8 cm at 1 o'clock position and a mass below the skin measuring 4 mm. 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 Pelvic bone pain: Instructed her to take Tylenol along with Claritin 2. Fatigue as expected  Monitoring blood counts closely RTC in 3 weeks for cycle 3

## 2021-04-15 ENCOUNTER — Ambulatory Visit: Payer: Managed Care, Other (non HMO)

## 2021-04-20 ENCOUNTER — Other Ambulatory Visit: Payer: Managed Care, Other (non HMO)

## 2021-04-20 ENCOUNTER — Ambulatory Visit: Payer: Managed Care, Other (non HMO)

## 2021-04-20 ENCOUNTER — Ambulatory Visit: Payer: Managed Care, Other (non HMO) | Admitting: Hematology and Oncology

## 2021-05-01 ENCOUNTER — Encounter: Payer: Self-pay | Admitting: Hematology and Oncology

## 2021-05-01 MED FILL — Dexamethasone Sodium Phosphate Inj 100 MG/10ML: INTRAMUSCULAR | Qty: 1 | Status: AC

## 2021-05-01 NOTE — Progress Notes (Signed)
Called pt to introduce myself as her Arboriculturist and to discuss the [employer].  I went over the grant and what it covers, pt would like to apply so she will bring proof of income on 05/04/21.  If approved I will give her an expense sheet and my card for any questions or concerns she may have in the future.

## 2021-05-02 NOTE — Progress Notes (Signed)
Patient Care Team: Pcp, No as PCP - General [name], MD as Consulting Physician (General Surgery) [name], MD as Consulting Physician (Hematology and Oncology) Kyung Rudd, MD as Consulting Physician (Radiation Oncology)  DIAGNOSIS:    ICD-10-CM   1. Malignant neoplasm of upper-outer quadrant of left breast in female, estrogen receptor negative ([place])  C50.412    Z17.1       SUMMARY OF ONCOLOGIC HISTORY: Oncology History  Malignant neoplasm of upper-outer quadrant of left breast in female, estrogen receptor negative ([place])  01/20/2021 Initial Diagnosis   Screening mammogram detected 2 asymmetric areas with calcifications detected in March 2022.  October 2022 mammogram revealed left breast calcifications 1.8 cm at 1 o'clock position and a mass below the skin measuring 4 mm.  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 The report dates are January 29, 2021 and February 09, 2021.   The BRCAplus panel offered by [employer] and includes sequencing and deletion/duplication analysis for the following 8 genes: ATM, BRCA1, BRCA2, CDH1, CHEK2, PALB2, PTEN, and TP53.  The CustomNext-Cancer+RNAinsight panel offered by [employer] includes sequencing and rearrangement analysis for the following 52 genes:  APC, ATM, AXIN2, BAP1, BARD1, BMPR1A, BRCA1, BRCA2, BRIP1, CDH1, CDK4, CDKN2A, CHEK2CTNNA1, DICER1, MEN1, MLH1, MSH2, MSH3, MSH6, MUTYH, NBN, NF1, NTHL1, PALB2, PMS2, POT1, PTCH1, PTEN, RAD50, RAD51C, RAD51D, SDHA, SDHB, SDHC, SDHD, SMAD4, SMARCA4, STK11, SUFU, TP53, TSC1, TSC2 and VHL (sequencing and deletion/duplication); HOXB13, KIT, MITF, PDGFRA, POLD1 and POLE (sequencing only); EPCAM and GREM1 (deletion/duplication only). RNA  data is routinely analyzed for use in variant interpretation for all genes.   02/09/2021 Surgery   Left Lumpectomy: Grade 3 IDC 0.6 cm with DCIS, Ant Margins pos for DCIS, LN Neg, Triple Negative   03/23/2021 -  Chemotherapy   Patient is on Treatment Plan : BREAST TC q21d       CHIEF COMPLIANT: Cycle 3 Taxotere and Cytoxan  INTERVAL HISTORY: Leah Chan is a 61 y.o. with above-mentioned history of malignant neoplasm of UOQ of the left breast currently on chemotherapy with TC. She presents to the clinic today for treatment.   ALLERGIES:  is allergic to latex and sulfa antibiotics.  MEDICATIONS:  Current Outpatient Medications  Medication Sig Dispense Refill   calcium carbonate (TUMS - DOSED IN MG ELEMENTAL CALCIUM) 500 MG chewable tablet Chew 1 tablet by mouth daily as needed for indigestion or heartburn.     dexamethasone (DECADRON) 4 MG tablet Take 1 tablet (4 mg total) by mouth 2 (two) times daily. Take 1 tablet day before chemo and 1 tablet day after chemo with food 8 tablet 0   escitalopram (LEXAPRO) 10 MG tablet Take 10 mg by mouth daily.     GARLIC PO Take 1 capsule by mouth 4 (four) times a week.     hydrochlorothiazide (HYDRODIURIL) 25 MG tablet Take 50 mg by mouth daily.     hydrOXYzine (ATARAX/VISTARIL) 25 MG tablet Take 50 mg by mouth at bedtime.     lidocaine-prilocaine (EMLA) cream Apply to affected area once 30 g 3   Multiple Minerals (CALCIUM-MAGNESIUM-ZINC) TABS Take 1 tablet by mouth daily.     ondansetron (ZOFRAN) 8 MG tablet Take 1 tablet (8 mg total) by mouth 2 (two) times daily as needed for refractory nausea / vomiting. Start  on day 3 after chemo. 30 tablet 1   pravastatin (PRAVACHOL) 10 MG tablet Take 10 mg by mouth at bedtime.     prochlorperazine (COMPAZINE) 10 MG tablet Take 1 tablet (10 mg total) by mouth every 6 (six) hours as needed for nausea or vomiting. 30 tablet 3   traMADol (ULTRAM) 50 MG tablet Take 1 tablet (50 mg total) by mouth every 6 (six) hours  as needed. 25 tablet 0   No current facility-administered medications for this visit.    PHYSICAL EXAMINATION: ECOG PERFORMANCE STATUS: 1 - Symptomatic but completely ambulatory  Vitals:   05/04/21 1153  BP: 137/73  Pulse: 71  Resp: 18  Temp: 97.9 F (36.6 C)  SpO2: 100%   Filed Weights   05/04/21 1153  Weight: 133 lb 12.8 oz (60.7 kg)    LABORATORY DATA:  I have reviewed the data as listed CMP Latest Ref Rng & Units 04/13/2021 03/30/2021 03/23/2021  Glucose 70 - 99 mg/dL 115(H) 151(H) 91  BUN 8 - 23 mg/dL _0 Creatinine 0.44 - 1.00 mg/dL 0.72 0.86 0.71  Sodium 135 - 145 mmol/L 138 137 137  Potassium 3.5 - 5.1 mmol/L 3.5 3.2(L) 3.6  Chloride 98 - 111 mmol/L 102 96(L) 101  CO2 22 - 32 mmol/L _1 Calcium 8.9 - 10.3 mg/dL 9.7 9.5 10.0  Total Protein 6.5 - 8.1 g/dL 6.7 6.8 7.2  Total Bilirubin 0.3 - 1.2 mg/dL 0.7 1.1 0.7  Alkaline Phos 38 - 126 U/L 65 73 60  AST 15 - 41 U/L _2 ALT 0 - 44 U/L _3 Lab Results  Component Value Date   WBC 13.4 (H) 05/04/2021   HGB 11.5 (L) 05/04/2021   HCT 33.0 (L) 05/04/2021   MCV 85.9 05/04/2021   PLT 282 05/04/2021   NEUTROABS 11.6 (H) 05/04/2021    ASSESSMENT & PLAN:  Malignant neoplasm of upper-outer quadrant of left breast in female, estrogen receptor negative ([place]) Screening mammogram detected 2 asymmetric areas with calcifications detected in March 2022.  October 2022 mammogram revealed left breast calcifications 1.8 cm at 1 o'clock position and a mass below the skin measuring 4 mm.  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  Adjuvant chemo with Taxotere and Cytoxan every 3 weeks x4 2. adjuvant  radiation ------------------------------------------------------------------------------------------------------------------------- Current treatment: Cycle 3 TC Chemo Toxicities: Pelvic bone pain: Instructed her to take Tylenol along with Claritin Fatigue as expected Chemo induced anemia: Mild   Monitoring blood counts closely RTC in 3 weeks for cycle 4    No orders of the defined types were placed in this encounter.  The patient has a good understanding of the overall plan. she agrees with it. she will call with any problems that may develop before the next visit here.  Total time spent: 30 mins including face to face time and time spent for planning, charting and coordination of care  Rulon Eisenmenger, MD, MPH 05/04/2021  I, Thana Ates, am acting as scribe for Dr. [name].  I have reviewed the above documentation for accuracy and completeness, and I agree with the above.

## 2021-05-04 ENCOUNTER — Inpatient Hospital Stay: Payer: Managed Care, Other (non HMO)

## 2021-05-04 ENCOUNTER — Inpatient Hospital Stay (HOSPITAL_BASED_OUTPATIENT_CLINIC_OR_DEPARTMENT_OTHER): Payer: Managed Care, Other (non HMO) | Admitting: Hematology and Oncology

## 2021-05-04 ENCOUNTER — Other Ambulatory Visit: Payer: Self-pay

## 2021-05-04 ENCOUNTER — Inpatient Hospital Stay: Payer: Managed Care, Other (non HMO) | Attending: Hematology and Oncology

## 2021-05-04 ENCOUNTER — Ambulatory Visit: Payer: Managed Care, Other (non HMO) | Attending: Surgery

## 2021-05-04 VITALS — Wt 134.1 lb

## 2021-05-04 DIAGNOSIS — C50412 Malignant neoplasm of upper-outer quadrant of left female breast: Secondary | ICD-10-CM | POA: Insufficient documentation

## 2021-05-04 DIAGNOSIS — M898X8 Other specified disorders of bone, other site: Secondary | ICD-10-CM | POA: Insufficient documentation

## 2021-05-04 DIAGNOSIS — Z171 Estrogen receptor negative status [ER-]: Secondary | ICD-10-CM

## 2021-05-04 DIAGNOSIS — Z79899 Other long term (current) drug therapy: Secondary | ICD-10-CM | POA: Diagnosis not present

## 2021-05-04 DIAGNOSIS — R5383 Other fatigue: Secondary | ICD-10-CM | POA: Diagnosis not present

## 2021-05-04 DIAGNOSIS — D6481 Anemia due to antineoplastic chemotherapy: Secondary | ICD-10-CM | POA: Insufficient documentation

## 2021-05-04 DIAGNOSIS — Z483 Aftercare following surgery for neoplasm: Secondary | ICD-10-CM | POA: Insufficient documentation

## 2021-05-04 DIAGNOSIS — Z95828 Presence of other vascular implants and grafts: Secondary | ICD-10-CM

## 2021-05-04 DIAGNOSIS — Z5111 Encounter for antineoplastic chemotherapy: Secondary | ICD-10-CM | POA: Diagnosis not present

## 2021-05-04 LAB — CBC WITH DIFFERENTIAL (CANCER CENTER ONLY)
Abs Immature Granulocytes: 0.06 10*3/uL (ref 0.00–0.07)
Basophils Absolute: 0 10*3/uL (ref 0.0–0.1)
Basophils Relative: 0 %
Eosinophils Absolute: 0 10*3/uL (ref 0.0–0.5)
Eosinophils Relative: 0 %
HCT: 33 % — ABNORMAL LOW (ref 36.0–46.0)
Hemoglobin: 11.5 g/dL — ABNORMAL LOW (ref 12.0–15.0)
Immature Granulocytes: 0 %
Lymphocytes Relative: 8 %
Lymphs Abs: 1.1 10*3/uL (ref 0.7–4.0)
MCH: 29.9 pg (ref 26.0–34.0)
MCHC: 34.8 g/dL (ref 30.0–36.0)
MCV: 85.9 fL (ref 80.0–100.0)
Monocytes Absolute: 0.7 10*3/uL (ref 0.1–1.0)
Monocytes Relative: 5 %
Neutro Abs: 11.6 10*3/uL — ABNORMAL HIGH (ref 1.7–7.7)
Neutrophils Relative %: 87 %
Platelet Count: 282 10*3/uL (ref 150–400)
RBC: 3.84 MIL/uL — ABNORMAL LOW (ref 3.87–5.11)
RDW: 16.2 % — ABNORMAL HIGH (ref 11.5–15.5)
WBC Count: 13.4 10*3/uL — ABNORMAL HIGH (ref 4.0–10.5)
nRBC: 0 % (ref 0.0–0.2)

## 2021-05-04 LAB — CMP (CANCER CENTER ONLY)
ALT: 35 U/L (ref 0–44)
AST: 23 U/L (ref 15–41)
Albumin: 4.3 g/dL (ref 3.5–5.0)
Alkaline Phosphatase: 65 U/L (ref 38–126)
Anion gap: 7 (ref 5–15)
BUN: 11 mg/dL (ref 8–23)
CO2: 28 mmol/L (ref 22–32)
Calcium: 9.7 mg/dL (ref 8.9–10.3)
Chloride: 104 mmol/L (ref 98–111)
Creatinine: 0.62 mg/dL (ref 0.44–1.00)
GFR, Estimated: >60 mL/min (ref >60)
Glucose, Bld: 96 mg/dL (ref 70–99)
Potassium: 3.8 mmol/L (ref 3.5–5.1)
Sodium: 139 mmol/L (ref 135–145)
Total Bilirubin: 0.6 mg/dL (ref 0.3–1.2)
Total Protein: 6.7 g/dL (ref 6.5–8.1)

## 2021-05-04 MED ORDER — SODIUM CHLORIDE 0.9 % IV SOLN
10.0000 mg | Freq: Once | INTRAVENOUS | Status: AC
  Administered 2021-05-04: 10 mg via INTRAVENOUS
  Filled 2021-05-04: qty 10

## 2021-05-04 MED ORDER — SODIUM CHLORIDE 0.9 % IV SOLN
600.0000 mg/m2 | Freq: Once | INTRAVENOUS | Status: AC
  Administered 2021-05-04: 980 mg via INTRAVENOUS
  Filled 2021-05-04: qty 49

## 2021-05-04 MED ORDER — SODIUM CHLORIDE 0.9% FLUSH
10.0000 mL | INTRAVENOUS | Status: DC | PRN
  Administered 2021-05-04: 10 mL

## 2021-05-04 MED ORDER — SODIUM CHLORIDE 0.9 % IV SOLN: Freq: Once | INTRAVENOUS | Status: AC

## 2021-05-04 MED ORDER — PALONOSETRON HCL INJECTION 0.25 MG/5ML
0.2500 mg | Freq: Once | INTRAVENOUS | Status: AC
  Administered 2021-05-04: 0.25 mg via INTRAVENOUS
  Filled 2021-05-04: qty 5

## 2021-05-04 MED ORDER — HEPARIN SOD (PORK) LOCK FLUSH 100 UNIT/ML IV SOLN
500.0000 [IU] | Freq: Once | INTRAVENOUS | Status: AC | PRN
  Administered 2021-05-04: 500 [IU]

## 2021-05-04 MED ORDER — SODIUM CHLORIDE 0.9% FLUSH
10.0000 mL | Freq: Once | INTRAVENOUS | Status: AC
  Administered 2021-05-04: 10 mL

## 2021-05-04 MED ORDER — SODIUM CHLORIDE 0.9 % IV SOLN
75.0000 mg/m2 | Freq: Once | INTRAVENOUS | Status: AC
  Administered 2021-05-04: 120 mg via INTRAVENOUS
  Filled 2021-05-04: qty 12

## 2021-05-04 NOTE — Therapy (Signed)
[facility] @ [place] [place], [place], [address] Phone: [phone]   Fax:  [phone]  Physical Therapy Treatment  Patient Details  Name: Leah Chan MRN: [id number] Date of Birth: 18-Sep-1960 Referring Provider (PT): Dr. [name]   Encounter Date: 05/04/2021   PT End of Session - 05/04/21 1037     Visit Number 2   # unchanged due to screen only   PT Start Time 1035    PT Stop Time 1040    PT Time Calculation (min) 5 min    Activity Tolerance Patient tolerated treatment well    Behavior During Therapy [name]             Past Medical History:  Diagnosis Date   Anxiety    Asthma    Cancer ([place])    Family history of prostate cancer 01/22/2021   Family history of skin cancer 01/22/2021   Genetic testing 02/02/2021   Negative hereditary cancer genetic testing: no pathogenic variants detected in [facility] Panel.  The report date is January 29, 2021.   The BRCAplus panel offered by [employer] and includes sequencing and deletion/duplication analysis for the following 8 genes: ATM, BRCA1, BRCA2, CDH1, CHEK2, PALB2, PTEN, and TP53.  Results of pan-cancer panel are pending.    Hypertension    Pneumonia 2010    Past Surgical History:  Procedure Laterality Date   BREAST LUMPECTOMY Right 2002   BREAST LUMPECTOMY WITH RADIOACTIVE SEED LOCALIZATION Left 02/09/2021   Procedure: LEFT BREAST LUMPECTOMY WITH RADIOACTIVE SEED LOCALIZATION (2 SEEDS);  Surgeon: [name], MD;  Location: [facility];  Service: General;  Laterality: Left;   PORTACATH PLACEMENT Right 03/03/2021   Procedure: INSERTION PORT-A-CATH;  Surgeon: [name], MD;  Location: [facility];  Service: General;  Laterality: Right;   SENTINEL NODE BIOPSY Left 02/09/2021   Procedure: LEFT AXILLARY SENTINEL LYMPH NODE BIOPSY;  Surgeon: [name], MD;  Location: [facility];  Service: General;   Laterality: Left;    Vitals:   05/04/21 1035  Weight: 134 lb 2 oz (60.8 kg)     Subjective Assessment - 05/04/21 1036     Subjective Pt returns for her 3 month L-Dex screen.    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 She underwent a left lumpectomy and sentinel node biopsy (2 negative nodes) on 02/09/2021. It is triple negative with a Ki67 of 15%.                    L-DEX FLOWSHEETS - 05/04/21 1000       L-DEX LYMPHEDEMA SCREENING   Measurement Type Unilateral    L-DEX MEASUREMENT EXTREMITY Upper Extremity    POSITION  Standing    DOMINANT SIDE Right    At Risk Side Left    BASELINE SCORE (UNILATERAL) 1.8    L-DEX SCORE (UNILATERAL) -2.9    VALUE CHANGE (UNILAT) -4.7                                     PT Long Term Goals - 03/02/21 1350       PT LONG TERM GOAL #1   Title Patient will demonstrate she has regained full shoulder ROM and function post operatively compared to baselines.    Time 8    Period Weeks  Status Achieved                   Plan - 05/04/21 1050     Clinical Impression Statement Pt returns for her 3 month L-Dex screen. Her change from baseline of -4.7 is WNLs so no further treatment is required at this time except to cont every 3 month L-Dex screen which pt is agreeable to.    PT Next Visit Plan Cont every 3 month L-Dex screen for up to 2 years from her SLNB (~02/10/2023)    Consulted and Agree with Plan of Care Patient             Patient will benefit from skilled therapeutic intervention in order to improve the following deficits and impairments:     Visit Diagnosis: Aftercare following surgery for neoplasm     Problem List Patient Active Problem List   Diagnosis Date Noted   Port-A-Cath in place 03/23/2021   Genetic testing 02/02/2021   Family history of prostate cancer 01/22/2021   Family history of skin  cancer 01/22/2021   Malignant neoplasm of upper-outer quadrant of left breast in female, estrogen receptor negative ([place]) 01/20/2021    [name], PTA 05/04/2021, 10:52 AM  [facility] @ [place], [place], [address] Phone: [phone]   Fax:  [phone]  Name: Leah Chan MRN: [id number] Date of Birth: 1960-04-09

## 2021-05-04 NOTE — Assessment & Plan Note (Signed)
Screening mammogram detected 2 asymmetric areas with calcifications detected in March 2022. October 2022 mammogram revealed left breast calcifications 1.8 cm at 1 o'clock position and a mass below the skin measuring 4 mm. 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 Pelvic bone pain: Instructed her to take Tylenol along with Claritin 2. Fatigue as expected 3. Chemo induced anemia: Mild  Monitoring blood counts closely RTC in 3 weeks for cycle 4

## 2021-05-04 NOTE — Patient Instructions (Signed)
[facility]  Discharge Instructions: Thank you for choosing [facility] to provide your oncology and hematology care.   If you have a lab appointment with the [facility], please go directly to the [facility] and check in at the registration area.   Wear comfortable clothing and clothing appropriate for easy access to any Portacath or PICC line.   We strive to give you quality time with your provider. You may need to reschedule your appointment if you arrive late (15 or more minutes).  Arriving late affects you and other patients whose appointments are after yours.  Also, if you miss three or more appointments without notifying the office, you may be dismissed from the clinic at the providers discretion.      For prescription refill requests, have your pharmacy contact our office and allow 72 hours for refills to be completed.    Today you received the following chemotherapy and/or immunotherapy agents: Docetaxel and Cytoxan      To help prevent nausea and vomiting after your treatment, we encourage you to take your nausea medication as directed.  BELOW ARE SYMPTOMS THAT SHOULD BE REPORTED IMMEDIATELY: *FEVER GREATER THAN 100.4 F (38 C) OR HIGHER *CHILLS OR SWEATING *NAUSEA AND VOMITING THAT IS NOT CONTROLLED WITH YOUR NAUSEA MEDICATION *UNUSUAL SHORTNESS OF BREATH *UNUSUAL BRUISING OR BLEEDING *URINARY PROBLEMS (pain or burning when urinating, or frequent urination) *BOWEL PROBLEMS (unusual diarrhea, constipation, pain near the anus) TENDERNESS IN MOUTH AND THROAT WITH OR WITHOUT PRESENCE OF ULCERS (sore throat, sores in mouth, or a toothache) UNUSUAL RASH, SWELLING OR PAIN  UNUSUAL VAGINAL DISCHARGE OR ITCHING   Items with * indicate a potential emergency and should be followed up as soon as possible or go to the Emergency Department if any problems should occur.  Please show the CHEMOTHERAPY ALERT CARD or IMMUNOTHERAPY ALERT CARD at  check-in to the Emergency Department and triage nurse.  Should you have questions after your visit or need to cancel or reschedule your appointment, please contact [facility]  Dept: [phone]  and follow the prompts.  Office hours are 8:00 a.m. to 4:30 p.m. Monday - Friday. Please note that voicemails left after 4:00 p.m. may not be returned until the following business day.  We are closed weekends and major holidays. You have access to a nurse at all times for urgent questions. Please call the main number to the clinic Dept: [phone] and follow the prompts.   For any non-urgent questions, you may also contact your provider using MyChart. We now offer e-Visits for anyone 71 and older to request care online for non-urgent symptoms. For details visit [email or web address].   Also download the MyChart app! Go to the app store, search "MyChart", open the app, select [facility], and log in with your MyChart username and password.  Due to Covid, a mask is required upon entering the hospital/clinic. If you do not have a mask, one will be given to you upon arrival. For doctor visits, patients may have 1 support person aged 51 or older with them. For treatment visits, patients cannot have anyone with them due to current Covid guidelines and our immunocompromised population.

## 2021-05-06 ENCOUNTER — Encounter: Payer: Self-pay | Admitting: Hematology and Oncology

## 2021-05-06 ENCOUNTER — Ambulatory Visit: Payer: Managed Care, Other (non HMO)

## 2021-05-11 ENCOUNTER — Encounter: Payer: Self-pay | Admitting: Hematology and Oncology

## 2021-05-11 ENCOUNTER — Ambulatory Visit: Payer: Managed Care, Other (non HMO)

## 2021-05-11 ENCOUNTER — Ambulatory Visit: Payer: Managed Care, Other (non HMO) | Admitting: Hematology and Oncology

## 2021-05-11 ENCOUNTER — Other Ambulatory Visit: Payer: Managed Care, Other (non HMO)

## 2021-05-11 NOTE — Progress Notes (Signed)
Pt is approved for the $1000 [employer] grant.  

## 2021-05-12 ENCOUNTER — Encounter (HOSPITAL_COMMUNITY): Payer: Self-pay

## 2021-05-13 ENCOUNTER — Ambulatory Visit: Payer: Managed Care, Other (non HMO)

## 2021-05-19 ENCOUNTER — Other Ambulatory Visit: Payer: Self-pay | Admitting: Hematology and Oncology

## 2021-05-21 NOTE — Progress Notes (Signed)
? ?Patient Care Team: ?Pcp, No as PCP - General ?[name], MD as Consulting Physician (General Surgery) ?[name], MD as Consulting Physician (Hematology and Oncology) ?Kyung Rudd, MD as Consulting Physician (Radiation Oncology) ? ?DIAGNOSIS:  ?Encounter Diagnosis  ?Name Primary?  ? Malignant neoplasm of upper-outer quadrant of left breast in female, estrogen receptor negative ([place])   ? ? ?SUMMARY OF ONCOLOGIC HISTORY: ?Oncology History  ?Malignant neoplasm of upper-outer quadrant of left breast in female, estrogen receptor negative ([place])  ?01/20/2021 Initial Diagnosis  ? Screening mammogram detected 2 asymmetric areas with calcifications detected in March 2022.  October 2022 mammogram revealed left breast calcifications 1.8 cm at 1 o'clock position and a mass below the skin measuring 4 mm.  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  ?01/29/2021 Genetic Testing  ? Negative hereditary cancer genetic testing: no pathogenic variants detected in [facility] BRCAPlus Panel or [facility] CustomNext-Cancer +RNAinsight Panel. The report dates are January 29, 2021 and February 09, 2021.  ? ?The BRCAplus panel offered by [employer] and includes sequencing and deletion/duplication analysis for the following 8 genes: ATM, BRCA1, BRCA2, CDH1, CHEK2, PALB2, PTEN, and TP53.  The CustomNext-Cancer+RNAinsight panel offered by [employer] includes sequencing and rearrangement analysis for the following 52 genes:  APC, ATM, AXIN2, BAP1, BARD1, BMPR1A, BRCA1, BRCA2, BRIP1, CDH1, CDK4, CDKN2A, CHEK2CTNNA1, DICER1, MEN1, MLH1, MSH2, MSH3, MSH6, MUTYH, NBN, NF1, NTHL1, PALB2, PMS2, POT1, PTCH1, PTEN, RAD50, RAD51C, RAD51D, SDHA, SDHB, SDHC, SDHD, SMAD4, SMARCA4, STK11, SUFU, TP53, TSC1, TSC2 and VHL (sequencing and deletion/duplication); HOXB13, KIT, MITF, PDGFRA, POLD1 and POLE (sequencing only); EPCAM and GREM1 (deletion/duplication only). RNA  data is routinely analyzed for use in variant interpretation for all genes. ?  ?02/09/2021 Surgery  ? Left Lumpectomy: Grade 3 IDC 0.6 cm with DCIS, Ant Margins pos for DCIS, LN Neg, Triple Negative ?  ?03/23/2021 -  Chemotherapy  ? Patient is on Treatment Plan : BREAST TC q21d  ?   ? ? ?CHIEF COMPLIANT: Cycle 4 Taxotere and Cytoxan ? ?INTERVAL HISTORY: Leah Chan is a  61 y.o. with above-mentioned history of malignant neoplasm of UOQ of the left breast currently on chemotherapy with TC. She presents to the clinic today for treatment. She states that she was having body ache and pain, mainly in her legs. She states that nothing makes it feel better. She states that she is having some fatigue but release with rest. She states a little neuropathy in fingers. No nausea at this time. ? ?ALLERGIES:  is allergic to latex and sulfa antibiotics. ? ?MEDICATIONS:  ?Current Outpatient Medications  ?Medication Sig Dispense Refill  ? calcium carbonate (TUMS - DOSED IN MG ELEMENTAL CALCIUM) 500 MG chewable tablet Chew 1 tablet by mouth daily as needed for indigestion or heartburn.    ? dexamethasone (DECADRON) 4 MG tablet Take 1 tablet (4 mg total) by mouth 2 (two) times daily. Take 1 tablet day before chemo and 1 tablet day after chemo with food 8 tablet 0  ? escitalopram (LEXAPRO) 10 MG tablet Take 10 mg by mouth daily.    ? GARLIC PO Take 1 capsule by mouth 4 (four) times a week.    ? hydrochlorothiazide (HYDRODIURIL) 25 MG tablet Take 50 mg by mouth daily.    ? hydrOXYzine (ATARAX/VISTARIL) 25 MG tablet Take 50 mg by mouth at bedtime.    ? lidocaine-prilocaine (EMLA) cream Apply to affected area once 30 g 3  ?  Multiple Minerals (CALCIUM-MAGNESIUM-ZINC) TABS Take 1 tablet by mouth daily.    ? ondansetron (ZOFRAN) 8 MG tablet Take 1 tablet (8 mg total) by mouth 2 (two) times daily as needed for refractory nausea / vomiting. Start on day 3 after chemo. 30 tablet 1  ? pravastatin (PRAVACHOL) 10 MG tablet Take 10 mg by mouth at  bedtime.    ? prochlorperazine (COMPAZINE) 10 MG tablet Take 1 tablet (10 mg total) by mouth every 6 (six) hours as needed for nausea or vomiting. 30 tablet 3  ? ?No current facility-administered medications for this visit.  ? ? ?PHYSICAL EXAMINATION: ?ECOG PERFORMANCE STATUS: 1 - Symptomatic but completely ambulatory ? ?Vitals:  ? 05/25/21 1030  ?BP: 125/61  ?Pulse: 80  ?Resp: 18  ?Temp: (!) 97.5 ?F (36.4 ?C)  ?SpO2: 100%  ? ?Filed Weights  ? 05/25/21 1030  ?Weight: 135 lb 8 oz (61.5 kg)  ? ?  ? ?LABORATORY DATA:  ?I have reviewed the data as listed ?CMP Latest Ref Rng & Units 05/04/2021 04/13/2021 03/30/2021  ?Glucose 70 - 99 mg/dL 96 115(H) 151(H)  ?BUN 8 - 23 mg/dL '11 10 11  ' ?Creatinine 0.44 - 1.00 mg/dL 0.62 0.72 0.86  ?Sodium 135 - 145 mmol/L 139 138 137  ?Potassium 3.5 - 5.1 mmol/L 3.8 3.5 3.2(L)  ?Chloride 98 - 111 mmol/L 104 102 96(L)  ?CO2 22 - 32 mmol/L '28 28 30  ' ?Calcium 8.9 - 10.3 mg/dL 9.7 9.7 9.5  ?Total Protein 6.5 - 8.1 g/dL 6.7 6.7 6.8  ?Total Bilirubin 0.3 - 1.2 mg/dL 0.6 0.7 1.1  ?Alkaline Phos 38 - 126 U/L 65 65 73  ?AST 15 - 41 U/L '23 19 16  ' ?ALT 0 - 44 U/L 35 25 20  ? ? ?Lab Results  ?Component Value Date  ? WBC 6.5 05/25/2021  ? HGB 11.2 (L) 05/25/2021  ? HCT 32.3 (L) 05/25/2021  ? MCV 88.0 05/25/2021  ? PLT 260 05/25/2021  ? NEUTROABS 4.9 05/25/2021  ? ? ?ASSESSMENT & PLAN:  ?Malignant neoplasm of upper-outer quadrant of left breast in female, estrogen receptor negative ([place]) ?Screening mammogram detected 2 asymmetric areas with calcifications detected in March 2022.  October 2022 mammogram revealed left breast calcifications 1.8 cm at 1 o'clock position and a mass below the skin measuring 4 mm.  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  ?04/11/20: Left Lumpectomy: Grade 3 IDC 0.6 cm with DCIS, Ant Margins pos for DCIS, LN Neg, Triple Negative   ?  ?Treatment plan: ?1.  Adjuvant chemo with Taxotere and Cytoxan every  3 weeks x4 ?2. adjuvant radiation ?------------------------------------------------------------------------------------------------------------------------- ?Current treatment: Cycle 4 TC ?Chemo Toxicities: ?bone pain: Instructed her to take Tylenol along with Claritin ?Fatigue as expected ?Chemo induced anemia: Mild ?  ?No neuropathy ?Monitoring blood counts closely ?Return to clinic after radiation is complete ? ? ? ?No orders of the defined types were placed in this encounter. ? ?The patient has a good understanding of the overall plan. she agrees with it. she will call with any problems that may develop before the next visit here. ?Total time spent: 30 mins including face to face time and time spent for planning, charting and co-ordination of care ? ? Harriette Ohara, MD ?05/25/21 ? ?I, Gardiner Coins, am acting as a scribe for Dr. Lindi Adie  ?  ?

## 2021-05-22 MED FILL — Dexamethasone Sodium Phosphate Inj 100 MG/10ML: INTRAMUSCULAR | Qty: 1 | Status: AC

## 2021-05-25 ENCOUNTER — Other Ambulatory Visit: Payer: Self-pay | Admitting: *Deleted

## 2021-05-25 ENCOUNTER — Inpatient Hospital Stay: Payer: Managed Care, Other (non HMO)

## 2021-05-25 ENCOUNTER — Other Ambulatory Visit: Payer: Self-pay

## 2021-05-25 ENCOUNTER — Encounter: Payer: Self-pay | Admitting: *Deleted

## 2021-05-25 ENCOUNTER — Inpatient Hospital Stay: Payer: Managed Care, Other (non HMO) | Attending: Hematology and Oncology

## 2021-05-25 ENCOUNTER — Inpatient Hospital Stay: Payer: Managed Care, Other (non HMO) | Admitting: Hematology and Oncology

## 2021-05-25 DIAGNOSIS — C50412 Malignant neoplasm of upper-outer quadrant of left female breast: Secondary | ICD-10-CM

## 2021-05-25 DIAGNOSIS — Z79899 Other long term (current) drug therapy: Secondary | ICD-10-CM | POA: Insufficient documentation

## 2021-05-25 DIAGNOSIS — Z5111 Encounter for antineoplastic chemotherapy: Secondary | ICD-10-CM | POA: Insufficient documentation

## 2021-05-25 DIAGNOSIS — Z171 Estrogen receptor negative status [ER-]: Secondary | ICD-10-CM

## 2021-05-25 DIAGNOSIS — T451X5A Adverse effect of antineoplastic and immunosuppressive drugs, initial encounter: Secondary | ICD-10-CM | POA: Diagnosis not present

## 2021-05-25 DIAGNOSIS — M898X9 Other specified disorders of bone, unspecified site: Secondary | ICD-10-CM | POA: Diagnosis not present

## 2021-05-25 DIAGNOSIS — D6481 Anemia due to antineoplastic chemotherapy: Secondary | ICD-10-CM | POA: Insufficient documentation

## 2021-05-25 DIAGNOSIS — Z95828 Presence of other vascular implants and grafts: Secondary | ICD-10-CM

## 2021-05-25 DIAGNOSIS — R5383 Other fatigue: Secondary | ICD-10-CM | POA: Insufficient documentation

## 2021-05-25 LAB — CBC WITH DIFFERENTIAL/PLATELET
Abs Immature Granulocytes: 0.02 10*3/uL (ref 0.00–0.07)
Basophils Absolute: 0.1 10*3/uL (ref 0.0–0.1)
Basophils Relative: 2 %
Eosinophils Absolute: 0 10*3/uL (ref 0.0–0.5)
Eosinophils Relative: 0 %
HCT: 32.3 % — ABNORMAL LOW (ref 36.0–46.0)
Hemoglobin: 11.2 g/dL — ABNORMAL LOW (ref 12.0–15.0)
Immature Granulocytes: 0 %
Lymphocytes Relative: 11 %
Lymphs Abs: 0.7 10*3/uL (ref 0.7–4.0)
MCH: 30.5 pg (ref 26.0–34.0)
MCHC: 34.7 g/dL (ref 30.0–36.0)
MCV: 88 fL (ref 80.0–100.0)
Monocytes Absolute: 0.7 10*3/uL (ref 0.1–1.0)
Monocytes Relative: 11 %
Neutro Abs: 4.9 10*3/uL (ref 1.7–7.7)
Neutrophils Relative %: 76 %
Platelets: 260 10*3/uL (ref 150–400)
RBC: 3.67 MIL/uL — ABNORMAL LOW (ref 3.87–5.11)
RDW: 16.1 % — ABNORMAL HIGH (ref 11.5–15.5)
WBC: 6.5 10*3/uL (ref 4.0–10.5)
nRBC: 0 % (ref 0.0–0.2)

## 2021-05-25 LAB — COMPREHENSIVE METABOLIC PANEL
ALT: 21 U/L (ref 0–44)
AST: 19 U/L (ref 15–41)
Albumin: 3.9 g/dL (ref 3.5–5.0)
Alkaline Phosphatase: 64 U/L (ref 38–126)
Anion gap: 5 (ref 5–15)
BUN: 10 mg/dL (ref 8–23)
CO2: 30 mmol/L (ref 22–32)
Calcium: 9.4 mg/dL (ref 8.9–10.3)
Chloride: 104 mmol/L (ref 98–111)
Creatinine, Ser: 0.61 mg/dL (ref 0.44–1.00)
GFR, Estimated: >60 mL/min (ref >60)
Glucose, Bld: 107 mg/dL — ABNORMAL HIGH (ref 70–99)
Potassium: 3.4 mmol/L — ABNORMAL LOW (ref 3.5–5.1)
Sodium: 139 mmol/L (ref 135–145)
Total Bilirubin: 0.6 mg/dL (ref 0.3–1.2)
Total Protein: 5.9 g/dL — ABNORMAL LOW (ref 6.5–8.1)

## 2021-05-25 MED ORDER — SODIUM CHLORIDE 0.9% FLUSH
10.0000 mL | INTRAVENOUS | Status: DC | PRN
  Administered 2021-05-25: 10 mL

## 2021-05-25 MED ORDER — SODIUM CHLORIDE 0.9 % IV SOLN
600.0000 mg/m2 | Freq: Once | INTRAVENOUS | Status: AC
  Administered 2021-05-25: 980 mg via INTRAVENOUS
  Filled 2021-05-25: qty 49

## 2021-05-25 MED ORDER — SODIUM CHLORIDE 0.9 % IV SOLN
75.0000 mg/m2 | Freq: Once | INTRAVENOUS | Status: AC
  Administered 2021-05-25: 120 mg via INTRAVENOUS
  Filled 2021-05-25: qty 12

## 2021-05-25 MED ORDER — HEPARIN SOD (PORK) LOCK FLUSH 100 UNIT/ML IV SOLN
500.0000 [IU] | Freq: Once | INTRAVENOUS | Status: AC | PRN
  Administered 2021-05-25: 500 [IU]

## 2021-05-25 MED ORDER — PALONOSETRON HCL INJECTION 0.25 MG/5ML
0.2500 mg | Freq: Once | INTRAVENOUS | Status: AC
  Administered 2021-05-25: 0.25 mg via INTRAVENOUS
  Filled 2021-05-25: qty 5

## 2021-05-25 MED ORDER — SODIUM CHLORIDE 0.9% FLUSH
10.0000 mL | Freq: Once | INTRAVENOUS | Status: AC
  Administered 2021-05-25: 10 mL

## 2021-05-25 MED ORDER — SODIUM CHLORIDE 0.9 % IV SOLN: Freq: Once | INTRAVENOUS | Status: AC

## 2021-05-25 MED ORDER — SODIUM CHLORIDE 0.9 % IV SOLN
10.0000 mg | Freq: Once | INTRAVENOUS | Status: AC
  Administered 2021-05-25: 10 mg via INTRAVENOUS
  Filled 2021-05-25: qty 10

## 2021-05-25 NOTE — Patient Instructions (Signed)
[facility]  Discharge Instructions: ?Thank you for choosing [facility] to provide your oncology and hematology care.  ? ?If you have a lab appointment with the [facility], please go directly to the [facility] and check in at the registration area. ?  ?Wear comfortable clothing and clothing appropriate for easy access to any Portacath or PICC line.  ? ?We strive to give you quality time with your provider. You may need to reschedule your appointment if you arrive late (15 or more minutes).  Arriving late affects you and other patients whose appointments are after yours.  Also, if you miss three or more appointments without notifying the office, you may be dismissed from the clinic at the provider?s discretion.    ?  ?For prescription refill requests, have your pharmacy contact our office and allow 72 hours for refills to be completed.   ? ?Today you received the following chemotherapy and/or immunotherapy agents Taxotere & Cytoxan    ?  ?To help prevent nausea and vomiting after your treatment, we encourage you to take your nausea medication as directed. ? ?BELOW ARE SYMPTOMS THAT SHOULD BE REPORTED IMMEDIATELY: ?*FEVER GREATER THAN 100.4 F (38 ?C) OR HIGHER ?*CHILLS OR SWEATING ?*NAUSEA AND VOMITING THAT IS NOT CONTROLLED WITH YOUR NAUSEA MEDICATION ?*UNUSUAL SHORTNESS OF BREATH ?*UNUSUAL BRUISING OR BLEEDING ?*URINARY PROBLEMS (pain or burning when urinating, or frequent urination) ?*BOWEL PROBLEMS (unusual diarrhea, constipation, pain near the anus) ?TENDERNESS IN MOUTH AND THROAT WITH OR WITHOUT PRESENCE OF ULCERS (sore throat, sores in mouth, or a toothache) ?UNUSUAL RASH, SWELLING OR PAIN  ?UNUSUAL VAGINAL DISCHARGE OR ITCHING  ? ?Items with * indicate a potential emergency and should be followed up as soon as possible or go to the Emergency Department if any problems should occur. ? ?Please show the CHEMOTHERAPY ALERT CARD or IMMUNOTHERAPY ALERT CARD at  check-in to the Emergency Department and triage nurse. ? ?Should you have questions after your visit or need to cancel or reschedule your appointment, please contact [facility]  Dept: [phone]  and follow the prompts.  Office hours are 8:00 a.m. to 4:30 p.m. Monday - Friday. Please note that voicemails left after 4:00 p.m. may not be returned until the following business day.  We are closed weekends and major holidays. You have access to a nurse at all times for urgent questions. Please call the main number to the clinic Dept: [phone] and follow the prompts. ? ? ?For any non-urgent questions, you may also contact your provider using MyChart. We now offer e-Visits for anyone 48 and older to request care online for non-urgent symptoms. For details visit [email or web address]. ?  ?Also download the MyChart app! Go to the app store, search "MyChart", open the app, select [facility], and log in with your MyChart username and password. ? ?Due to Covid, a mask is required upon entering the hospital/clinic. If you do not have a mask, one will be given to you upon arrival. For doctor visits, patients may have 1 support person aged 58 or older with them. For treatment visits, patients cannot have anyone with them due to current Covid guidelines and our immunocompromised population.  ? ?

## 2021-05-25 NOTE — Assessment & Plan Note (Addendum)
Screening mammogram detected 2 asymmetric areas with calcifications detected in March 2022. ?October 2022 mammogram revealed left breast calcifications 1.8 cm at 1 o'clock position and a mass below the skin measuring 4 mm. 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 ?04/11/20:?Left Lumpectomy: Grade 3 IDC 0.6 cm with DCIS, Ant Margins pos for DCIS, LN Neg, Triple Negative?? ?? ?Treatment plan: ?1.??Adjuvant chemo?with Taxotere and Cytoxan every 3 weeks x4 ?2.?adjuvant radiation ?------------------------------------------------------------------------------------------------------------------------- ?Current treatment:?Cycle?4?TC ?Chemo Toxicities: ?1. bone pain: Instructed her to take Tylenol along with Claritin ?2. Fatigue as expected ?3. Chemo induced anemia: Mild ?? ?No neuropathy ?Monitoring blood counts closely ?Return to clinic after radiation is complete ?

## 2021-05-27 ENCOUNTER — Ambulatory Visit: Payer: Managed Care, Other (non HMO)

## 2021-05-27 ENCOUNTER — Telehealth: Payer: Self-pay

## 2021-05-27 NOTE — Telephone Encounter (Signed)
Spoke w/ patient, verified identity, and reminded patient of her 8:00am-05/28/21 in-person appointment w/ Shona Simpson PA-C. I advised patient to arrive 7mn early for check-in. I left my extension [phone] case patient needs anything. Patient verbalized understanding. ?

## 2021-05-28 ENCOUNTER — Ambulatory Visit
Admission: RE | Admit: 2021-05-28 | Discharge: 2021-05-28 | Disposition: A | Discharge status: Home / Self Care | Payer: Managed Care, Other (non HMO) | Source: Ambulatory Visit | Attending: Radiation Oncology | Admitting: Radiation Oncology

## 2021-05-28 ENCOUNTER — Encounter: Payer: Self-pay | Admitting: Radiation Oncology

## 2021-05-28 ENCOUNTER — Ambulatory Visit: Payer: Managed Care, Other (non HMO) | Admitting: Radiation Oncology

## 2021-05-28 ENCOUNTER — Other Ambulatory Visit: Payer: Self-pay

## 2021-05-28 ENCOUNTER — Telehealth: Payer: Self-pay | Admitting: Licensed Clinical Social Worker

## 2021-05-28 ENCOUNTER — Encounter: Payer: Self-pay | Admitting: *Deleted

## 2021-05-28 VITALS — BP 129/69 | HR 88 | Temp 97.6°F | Resp 18 | Ht 63.0 in | Wt 134.0 lb

## 2021-05-28 DIAGNOSIS — Z171 Estrogen receptor negative status [ER-]: Secondary | ICD-10-CM | POA: Insufficient documentation

## 2021-05-28 DIAGNOSIS — I10 Essential (primary) hypertension: Secondary | ICD-10-CM | POA: Insufficient documentation

## 2021-05-28 DIAGNOSIS — J45909 Unspecified asthma, uncomplicated: Secondary | ICD-10-CM | POA: Insufficient documentation

## 2021-05-28 DIAGNOSIS — Z8052 Family history of malignant neoplasm of bladder: Secondary | ICD-10-CM | POA: Diagnosis not present

## 2021-05-28 DIAGNOSIS — C50412 Malignant neoplasm of upper-outer quadrant of left female breast: Secondary | ICD-10-CM | POA: Diagnosis present

## 2021-05-28 DIAGNOSIS — Z7952 Long term (current) use of systemic steroids: Secondary | ICD-10-CM | POA: Diagnosis not present

## 2021-05-28 DIAGNOSIS — Z8042 Family history of malignant neoplasm of prostate: Secondary | ICD-10-CM | POA: Insufficient documentation

## 2021-05-28 NOTE — Progress Notes (Signed)
Patient here for "Consult" appointment w/ Shona Simpson PA-C. I spoke w/ patient, verified identity, and began nursing interview. Patient reports LT breast tenderness 3/10. No other issues reported to me at this time. ? ?Meaningful use complete. ?Patient states "IUD in place, w/ NO chances of pregnancy." ? ?BP 129/69 (BP Location: Right Arm, Patient Position: Sitting, Cuff Size: Normal)   Pulse 88   Temp 97.6 ?F (36.4 ?C) (Temporal)   Resp 18   Ht '5\' 3"'$  (1.6 m)   Wt 134 lb (60.8 kg)   SpO2 99%   BMI 23.74 kg/m?  ? ?

## 2021-05-28 NOTE — Progress Notes (Signed)
?Radiation Oncology         [phone] ?________________________________ ? ?Name: Leah Chan        MRN: [id number]  ?Date of Service: 05/28/2021 DOB: 04-01-1960 ? ?CC:Pcp, No  [name], MD    ? ?REFERRING PHYSICIAN: [name], MD ? ? ?DIAGNOSIS: There were no encounter diagnoses. ? ? ?HISTORY OF PRESENT ILLNESS: Leah Chan is a 61 y.o. female originally seen in the multidisciplinary breast clinic for a new diagnosis of left breast cancer. The patient was found to have a couple of areas of calcification within the left breast on screening mammogram.  Further imaging work-up revealed a 1.8 cm tumor as well as a separate 0.4 cm mass.  No axillary adenopathy was seen on ultrasound.  Biopsy was performed of the larger area and this returned positive for a grade 3 ductal carcinoma in situ.  Receptor studies indicated that the tumor is estrogen receptor negative and progesterone receptor negative.  A smaller 0.4 cm mass returned positive for a grade 3 invasive ductal carcinoma which was triple negative.  The Ki-67 was 15%.  ? ?Since her last visit, the patient had genetic testing which was negative.  She underwent left lumpectomy on 02/09/2021, this showed grade 3 invasive ductal carcinoma measuring 6 mm with associated DCIS, her anterior margins were positive for DCIS, and additional anterior margin showed invasive ductal carcinoma involving unoriented inked resection margin and DCIS less than 1 mm from the closest margin.  Overall the tumor was grade 3, 2 sentinel lymph nodes were sampled and neither contained metastatic disease.  She received adjuvant chemotherapy between 03/23/2021 and 05/25/2021.  She is seen to proceed with adjuvant radiotherapy to the left breast. ? ? ?PREVIOUS RADIATION THERAPY: No ? ? ?PAST MEDICAL HISTORY:  ?Past Medical History:  ?Diagnosis Date  ? Anxiety   ? Asthma   ? Cancer [facility])   ? Family history of prostate cancer 01/22/2021  ? Family history of skin cancer 01/22/2021   ? Genetic testing 02/02/2021  ? Negative hereditary cancer genetic testing: no pathogenic variants detected in [facility] BRCAPlus Panel.  The report date is January 29, 2021.   The BRCAplus panel offered by [employer] and includes sequencing and deletion/duplication analysis for the following 8 genes: ATM, BRCA1, BRCA2, CDH1, CHEK2, PALB2, PTEN, and TP53.  Results of pan-cancer panel are pending.   ? Hypertension   ? Pneumonia 2010  ?   ? ? ?PAST SURGICAL HISTORY: ?Past Surgical History:  ?Procedure Laterality Date  ? BREAST LUMPECTOMY Right 2002  ? BREAST LUMPECTOMY WITH RADIOACTIVE SEED LOCALIZATION Left 02/09/2021  ? Procedure: LEFT BREAST LUMPECTOMY WITH RADIOACTIVE SEED LOCALIZATION (2 SEEDS);  Surgeon: [name], MD;  Location: [facility];  Service: General;  Laterality: Left;  ? PORTACATH PLACEMENT Right 03/03/2021  ? Procedure: INSERTION PORT-A-CATH;  Surgeon: [name], MD;  Location: [facility];  Service: General;  Laterality: Right;  ? SENTINEL NODE BIOPSY Left 02/09/2021  ? Procedure: LEFT AXILLARY SENTINEL LYMPH NODE BIOPSY;  Surgeon: [name], MD;  Location: [facility];  Service: General;  Laterality: Left;  ? ? ? ?FAMILY HISTORY:  ?Family History  ?Problem Relation Age of Onset  ? Prostate cancer Father   ?     mets; d. 22  ? Prostate cancer Maternal Uncle   ?     dx after 50  ? ? ? ?SOCIAL HISTORY:  reports that she has never smoked. She does not have any smokeless tobacco history on file.  She reports that she does not drink alcohol and does not use drugs.  The patient is married and lives in [place].  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 ? ? ?ALLERGIES: Latex and Sulfa antibiotics ? ? ?MEDICATIONS:  ?Current Outpatient Medications  ?Medication Sig Dispense Refill  ? calcium carbonate (TUMS - DOSED IN MG ELEMENTAL CALCIUM) 500 MG chewable tablet Chew 1 tablet by mouth daily as needed for indigestion or heartburn.    ?  dexamethasone (DECADRON) 4 MG tablet Take 1 tablet (4 mg total) by mouth 2 (two) times daily. Take 1 tablet day before chemo and 1 tablet day after chemo with food 8 tablet 0  ? escitalopram (LEXAPRO) 10 MG tablet Take 10 mg by mouth daily.    ? GARLIC PO Take 1 capsule by mouth 4 (four) times a week.    ? hydrochlorothiazide (HYDRODIURIL) 25 MG tablet Take 50 mg by mouth daily.    ? hydrOXYzine (ATARAX/VISTARIL) 25 MG tablet Take 50 mg by mouth at bedtime.    ? lidocaine-prilocaine (EMLA) cream Apply to affected area once 30 g 3  ? Multiple Minerals (CALCIUM-MAGNESIUM-ZINC) TABS Take 1 tablet by mouth daily.    ? ondansetron (ZOFRAN) 8 MG tablet Take 1 tablet (8 mg total) by mouth 2 (two) times daily as needed for refractory nausea / vomiting. Start on day 3 after chemo. 30 tablet 1  ? pravastatin (PRAVACHOL) 10 MG tablet Take 10 mg by mouth at bedtime.    ? prochlorperazine (COMPAZINE) 10 MG tablet Take 1 tablet (10 mg total) by mouth every 6 (six) hours as needed for nausea or vomiting. 30 tablet 3  ? ?No current facility-administered medications for this encounter.  ? ? ? ?REVIEW OF SYSTEMS: On review of systems, the patient reports that she is doing well overall.  She states that she was very pleased with how well she tolerated chemotherapy with the exception of fatigue.  No other breast specific complaints are noted. ? ?  ? ?PHYSICAL EXAM:  ?Wt Readings from Last 3 Encounters:  ?05/28/21 134 lb (60.8 kg)  ?05/25/21 135 lb 8 oz (61.5 kg)  ?05/04/21 133 lb 12.8 oz (60.7 kg)  ? ?Temp Readings from Last 3 Encounters:  ?05/28/21 97.6 ?F (36.4 ?C) (Temporal)  ?05/25/21 (!) 97.5 ?F (36.4 ?C) (Temporal)  ?05/04/21 97.9 ?F (36.6 ?C) (Temporal)  ? ?BP Readings from Last 3 Encounters:  ?05/28/21 129/69  ?05/25/21 125/61  ?05/04/21 137/73  ? ?Pulse Readings from Last 3 Encounters:  ?05/28/21 88  ?05/25/21 80  ?05/04/21 71  ? ? ?In general this is a well appearing Caucasian female in no acute distress. She's alert and  oriented x4 and appropriate throughout the examination. Cardiopulmonary assessment is negative for acute distress and she exhibits normal effort.  Her left breast reveals a well-healed surgical incision site without erythema separation or drainage. ? ? ? ?ECOG = 1 ? ?0 - Asymptomatic (Fully active, able to carry on all predisease activities without restriction) ? ?1 - Symptomatic but completely ambulatory (Restricted in physically strenuous activity but ambulatory and able to carry out work of a light or sedentary nature. For example, light housework, office work) ? ?2 - Symptomatic, <50% in bed during the day (Ambulatory and capable of all self care but unable to carry out any work activities. Up and about more than 50% of waking hours) ? ?3 - Symptomatic, >50% in bed, but not bedbound (Capable of only limited  self-care, confined to bed or chair 50% or more of waking hours) ? ?4 - Bedbound (Completely disabled. Cannot carry on any self-care. Totally confined to bed or chair) ? ?5 - Death ? ? Oken MM, Creech RH, Tormey DC, et al. [phone]). "Toxicity and response criteria of the [facility] Group". [place] Oncol. 5 (6): 649-55 ? ? ? ?LABORATORY DATA:  ?Lab Results  ?Component Value Date  ? WBC 6.5 05/25/2021  ? HGB 11.2 (L) 05/25/2021  ? HCT 32.3 (L) 05/25/2021  ? MCV 88.0 05/25/2021  ? PLT 260 05/25/2021  ? ?Lab Results  ?Component Value Date  ? NA 139 05/25/2021  ? K 3.4 (L) 05/25/2021  ? CL 104 05/25/2021  ? CO2 30 05/25/2021  ? ?Lab Results  ?Component Value Date  ? ALT 21 05/25/2021  ? AST 19 05/25/2021  ? ALKPHOS 64 05/25/2021  ? BILITOT 0.6 05/25/2021  ? ?  ? ?RADIOGRAPHY: No results found. ?   ? ?IMPRESSION/PLAN: ?1. Stage IB, cT1bN0M0, grade 3, Triple negative invasive ductal carcinoma of the left breast. Dr. Lisbeth Renshaw has reviewed her final pathology.  I also reviewed this with her today and her course since her last visit.  Dr. Lisbeth Renshaw would recommend proceeding now but systemic therapy and  surgery are complete with adjuvant external beam radiotherapy to the left breast  to reduce risks of local recurrence followed by antiestrogen therapy. We discussed the risks, benefits, short, and long term

## 2021-05-28 NOTE — Telephone Encounter (Signed)
[facility] ?Clinical Social Work ? ?Clinical Social Work was referred by  radiation oncology  for assessment of psychosocial needs.  Clinical Social Worker  attempted to contact pt by phone   to offer support and assess for needs.   ?No answer. Left VM with direct contact information. ? ? ? ? ? ?Leah Chan E Leah Glanz, LCSW  ?Clinical Social Worker ?[facility] ?      ? ?

## 2021-05-29 ENCOUNTER — Ambulatory Visit: Payer: Managed Care, Other (non HMO) | Admitting: Radiation Oncology

## 2021-05-29 ENCOUNTER — Other Ambulatory Visit: Payer: Self-pay | Admitting: Surgery

## 2021-06-02 ENCOUNTER — Encounter: Payer: Self-pay | Admitting: Licensed Clinical Social Worker

## 2021-06-02 NOTE — Progress Notes (Signed)
[facility] Clinical Social Work  ?Initial Assessment ? ? ?Leah Chan is a 61 y.o. year old female contacted by phone. Clinical Social Work was referred by  radiation oncology  for assessment of psychosocial needs.  ? ?SDOH (Social Determinants of Health) assessments performed: Yes ?SDOH Interventions   ? ?Flowsheet Row Most Recent Value  ?SDOH Interventions   ?Financial Strain Interventions Other (Comment)  [cancer foundations]  ? ?  ?  ?Distress Screen completed: No ?ONCBCN DISTRESS SCREENING 05/28/2021  ?Screening Type -  ?Distress experienced in past week (1-10) 7  ?Practical problem type -  ?Emotional problem type Adjusting to illness;Nervousness/Anxiety  ?Referral to support programs -  ? ? ? ? ?Family/Social Information:  ?Housing Arrangement: patient lives with husband ?Family members/support persons in your life? Family ?Transportation concerns: yes, cost of gas  ?Employment: out of work due to treatment. Income source: Short-Term Disability (only $65/week) and husband's social security ?Financial concerns: Yes, due to illness and/or loss of work during treatment ?Type of concern: Utilities, Rent/ mortgage, and Medical bills ?Services Currently in place:  short-term disability through work; applied for social security disability ? ?Coping/ Adjustment to diagnosis: ?Patient understands treatment plan and what happens next? yes, about to start radiation treatment ?Concerns about diagnosis and/or treatment: How I will pay for the services I need ?Patient reported stressors: Finances ?Current coping skills/ strengths: Motivation for treatment/growth  ? ? ? SUMMARY: ?Current SDOH Barriers:  ?Financial constraints related to loss of work during treatment ? ?Clinical Social Work Clinical Goal(s):  ?Patient will apply for assistance through cancer foundations provided by CSW . ?Patient will bring in bills for assistance through [employer] (already approved) ? ?Interventions: ?Informed patient of the support team  roles and support services at [facility] ?Provided CSW contact information and encouraged patient to call with any questions or concerns ?Provided patient with information about assistance through cancer foundations (emailed per pt request) ? ? ?Follow Up Plan: Patient will work on applications for assistance and contact CSW with any questions ?Patient verbalizes understanding of plan: Yes ? ? ? ?[name], LCSW ?

## 2021-06-17 ENCOUNTER — Ambulatory Visit: Payer: Managed Care, Other (non HMO) | Admitting: Radiation Oncology

## 2021-06-18 ENCOUNTER — Ambulatory Visit
Admission: RE | Admit: 2021-06-18 | Discharge: 2021-06-18 | Disposition: A | Discharge status: Home / Self Care | Payer: Managed Care, Other (non HMO) | Source: Ambulatory Visit | Attending: Radiation Oncology | Admitting: Radiation Oncology

## 2021-06-18 ENCOUNTER — Other Ambulatory Visit: Payer: Self-pay

## 2021-06-18 DIAGNOSIS — Z170 Estrogen receptor positive status [ER+]: Secondary | ICD-10-CM | POA: Insufficient documentation

## 2021-06-18 DIAGNOSIS — C50412 Malignant neoplasm of upper-outer quadrant of left female breast: Secondary | ICD-10-CM | POA: Diagnosis present

## 2021-06-18 DIAGNOSIS — Z510 Encounter for antineoplastic radiation therapy: Secondary | ICD-10-CM | POA: Diagnosis not present

## 2021-06-24 ENCOUNTER — Telehealth: Payer: Self-pay | Admitting: Hematology and Oncology

## 2021-06-24 ENCOUNTER — Encounter: Payer: Self-pay | Admitting: *Deleted

## 2021-06-24 NOTE — Telephone Encounter (Signed)
Per 4/12 in basket called pt and spoke to pt.  Pt confirmed appointment . ?

## 2021-06-30 ENCOUNTER — Ambulatory Visit
Admission: RE | Admit: 2021-06-30 | Discharge: 2021-06-30 | Disposition: A | Discharge status: Home / Self Care | Payer: Managed Care, Other (non HMO) | Source: Ambulatory Visit | Attending: Radiation Oncology | Admitting: Radiation Oncology

## 2021-06-30 ENCOUNTER — Other Ambulatory Visit: Payer: Self-pay

## 2021-06-30 DIAGNOSIS — C50412 Malignant neoplasm of upper-outer quadrant of left female breast: Secondary | ICD-10-CM | POA: Diagnosis not present

## 2021-06-30 LAB — RAD ONC ARIA SESSION SUMMARY
Course Elapsed Days: 0
Plan Fractions Treated to Date: 1
Plan Prescribed Dose Per Fraction: 2.66 Gy
Plan Total Fractions Prescribed: 16
Plan Total Prescribed Dose: 42.56 Gy
Reference Point Dosage Given to Date: 2.66 Gy
Reference Point Session Dosage Given: 2.66 Gy
Session Number: 1

## 2021-07-01 ENCOUNTER — Ambulatory Visit: Admission: RE | Admit: 2021-07-01 | Payer: Managed Care, Other (non HMO) | Source: Ambulatory Visit

## 2021-07-01 DIAGNOSIS — C50412 Malignant neoplasm of upper-outer quadrant of left female breast: Secondary | ICD-10-CM | POA: Diagnosis not present

## 2021-07-02 ENCOUNTER — Ambulatory Visit
Admission: RE | Admit: 2021-07-02 | Discharge: 2021-07-02 | Disposition: A | Discharge status: Home / Self Care | Payer: Managed Care, Other (non HMO) | Source: Ambulatory Visit | Attending: Radiation Oncology | Admitting: Radiation Oncology

## 2021-07-02 ENCOUNTER — Other Ambulatory Visit: Payer: Self-pay

## 2021-07-02 DIAGNOSIS — C50412 Malignant neoplasm of upper-outer quadrant of left female breast: Secondary | ICD-10-CM | POA: Diagnosis not present

## 2021-07-02 LAB — RAD ONC ARIA SESSION SUMMARY
Course Elapsed Days: 2
Plan Fractions Treated to Date: 3
Plan Prescribed Dose Per Fraction: 2.66 Gy
Plan Total Fractions Prescribed: 16
Plan Total Prescribed Dose: 42.56 Gy
Reference Point Dosage Given to Date: 7.98 Gy
Reference Point Session Dosage Given: 2.66 Gy
Session Number: 2

## 2021-07-03 ENCOUNTER — Other Ambulatory Visit: Payer: Self-pay

## 2021-07-03 ENCOUNTER — Ambulatory Visit
Admission: RE | Admit: 2021-07-03 | Discharge: 2021-07-03 | Disposition: A | Discharge status: Home / Self Care | Payer: Managed Care, Other (non HMO) | Source: Ambulatory Visit | Attending: Radiation Oncology | Admitting: Radiation Oncology

## 2021-07-03 DIAGNOSIS — Z171 Estrogen receptor negative status [ER-]: Secondary | ICD-10-CM

## 2021-07-03 DIAGNOSIS — C50412 Malignant neoplasm of upper-outer quadrant of left female breast: Secondary | ICD-10-CM | POA: Diagnosis not present

## 2021-07-03 LAB — RAD ONC ARIA SESSION SUMMARY
Course Elapsed Days: 3
Plan Fractions Treated to Date: 1
Plan Prescribed Dose Per Fraction: 2.66 Gy
Plan Total Fractions Prescribed: 13
Plan Total Prescribed Dose: 34.58 Gy
Reference Point Dosage Given to Date: 10.64 Gy
Reference Point Session Dosage Given: 2.66 Gy
Session Number: 3

## 2021-07-03 MED ORDER — RADIAPLEXRX EX GEL: Freq: Once | CUTANEOUS | Status: AC

## 2021-07-03 MED ORDER — ALRA NON-METALLIC DEODORANT (RAD-ONC)
1.0000 "application " | Freq: Once | TOPICAL | Status: AC
  Administered 2021-07-03: 1 via TOPICAL

## 2021-07-03 NOTE — Progress Notes (Signed)
? ?Patient Care Team: ?Pcp, No as PCP - General ?[name], MD as Consulting Physician (General Surgery) ?[name], MD as Consulting Physician (Hematology and Oncology) ?Kyung Rudd, MD as Consulting Physician (Radiation Oncology) ? ?DIAGNOSIS:  ?Encounter Diagnosis  ?Name Primary?  ? Malignant neoplasm of upper-outer quadrant of left breast in female, estrogen receptor negative ([place])   ? ? ?SUMMARY OF ONCOLOGIC HISTORY: ?Oncology History  ?Malignant neoplasm of upper-outer quadrant of left breast in female, estrogen receptor negative ([place])  ?01/20/2021 Initial Diagnosis  ? Screening mammogram detected 2 asymmetric areas with calcifications detected in March 2022.  October 2022 mammogram revealed left breast calcifications 1.8 cm at 1 o'clock position and a mass below the skin measuring 4 mm.  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  ?01/29/2021 Genetic Testing  ? Negative hereditary cancer genetic testing: no pathogenic variants detected in [facility] BRCAPlus Panel or [facility] CustomNext-Cancer +RNAinsight Panel. The report dates are January 29, 2021 and February 09, 2021.  ? ?The BRCAplus panel offered by [employer] and includes sequencing and deletion/duplication analysis for the following 8 genes: ATM, BRCA1, BRCA2, CDH1, CHEK2, PALB2, PTEN, and TP53.  The CustomNext-Cancer+RNAinsight panel offered by [employer] includes sequencing and rearrangement analysis for the following 52 genes:  APC, ATM, AXIN2, BAP1, BARD1, BMPR1A, BRCA1, BRCA2, BRIP1, CDH1, CDK4, CDKN2A, CHEK2CTNNA1, DICER1, MEN1, MLH1, MSH2, MSH3, MSH6, MUTYH, NBN, NF1, NTHL1, PALB2, PMS2, POT1, PTCH1, PTEN, RAD50, RAD51C, RAD51D, SDHA, SDHB, SDHC, SDHD, SMAD4, SMARCA4, STK11, SUFU, TP53, TSC1, TSC2 and VHL (sequencing and deletion/duplication); HOXB13, KIT, MITF, PDGFRA, POLD1 and POLE (sequencing only); EPCAM and GREM1 (deletion/duplication only). RNA  data is routinely analyzed for use in variant interpretation for all genes. ?  ?02/09/2021 Surgery  ? Left Lumpectomy: Grade 3 IDC 0.6 cm with DCIS, Ant Margins pos for DCIS, LN Neg, Triple Negative ?  ?03/23/2021 -  Chemotherapy  ? Patient is on Treatment Plan : BREAST TC q21d  ? ?  ?  ? ? ?CHIEF COMPLIANT: Follow-up after radiation ? ?INTERVAL HISTORY: Leah Chan is a  61 y.o. with above-mentioned history of malignant neoplasm of UOQ of the left breast currently on chemotherapy with TC. She presents to the clinic today for a follow-up after radiation.  She still has radiation until 15 May.  She is tolerating it fairly well.  He does feel fatigued. ? ?ALLERGIES:  is allergic to latex and sulfa antibiotics. ? ?MEDICATIONS:  ?Current Outpatient Medications  ?Medication Sig Dispense Refill  ? calcium carbonate (TUMS - DOSED IN MG ELEMENTAL CALCIUM) 500 MG chewable tablet Chew 1 tablet by mouth daily as needed for indigestion or heartburn.    ? dexamethasone (DECADRON) 4 MG tablet Take 1 tablet (4 mg total) by mouth 2 (two) times daily. Take 1 tablet day before chemo and 1 tablet day after chemo with food 8 tablet 0  ? escitalopram (LEXAPRO) 10 MG tablet Take 10 mg by mouth daily.    ? GARLIC PO Take 1 capsule by mouth 4 (four) times a week.    ? hydrochlorothiazide (HYDRODIURIL) 25 MG tablet Take 50 mg by mouth daily.    ? hydrOXYzine (ATARAX/VISTARIL) 25 MG tablet Take 50 mg by mouth at bedtime.    ? lidocaine-prilocaine (EMLA) cream Apply to affected area once 30 g 3  ? Multiple Minerals (CALCIUM-MAGNESIUM-ZINC) TABS Take 1 tablet by mouth daily.    ? ondansetron (ZOFRAN) 8 MG tablet Take 1 tablet (8  mg total) by mouth 2 (two) times daily as needed for refractory nausea / vomiting. Start on day 3 after chemo. 30 tablet 1  ? pravastatin (PRAVACHOL) 10 MG tablet Take 10 mg by mouth at bedtime.    ? prochlorperazine (COMPAZINE) 10 MG tablet Take 1 tablet (10 mg total) by mouth every 6 (six) hours as needed for  nausea or vomiting. 30 tablet 3  ? ?No current facility-administered medications for this visit.  ? ? ?PHYSICAL EXAMINATION: ?ECOG PERFORMANCE STATUS: 1 - Symptomatic but completely ambulatory ? ?Vitals:  ? 07/17/21 0958  ?BP: 137/78  ?Pulse: 64  ?Resp: 16  ?Temp: 97.9 ?F (36.6 ?C)  ?SpO2: 100%  ? ?Filed Weights  ? 07/17/21 0958  ?Weight: 133 lb 1.6 oz (60.4 kg)  ?  ? ?LABORATORY DATA:  ?I have reviewed the data as listed ? ?  Latest Ref Rng & Units 05/25/2021  ? 10:10 AM 05/04/2021  ? 11:37 AM 04/13/2021  ? 10:36 AM  ?CMP  ?Glucose 70 - 99 mg/dL 107   96   115    ?BUN 8 - 23 mg/dL _0 ?Creatinine 0.44 - 1.00 mg/dL 0.61   0.62   0.72    ?Sodium 135 - 145 mmol/L 139   139   138    ?Potassium 3.5 - 5.1 mmol/L 3.4   3.8   3.5    ?Chloride 98 - 111 mmol/L 104   104   102    ?CO2 22 - 32 mmol/L _1 ?Calcium 8.9 - 10.3 mg/dL 9.4   9.7   9.7    ?Total Protein 6.5 - 8.1 g/dL 5.9   6.7   6.7    ?Total Bilirubin 0.3 - 1.2 mg/dL 0.6   0.6   0.7    ?Alkaline Phos 38 - 126 U/L 64   65   65    ?AST 15 - 41 U/L _2 ?ALT 0 - 44 U/L 21   35   25    ? ? ?Lab Results  ?Component Value Date  ? WBC 6.5 05/25/2021  ? HGB 11.2 (L) 05/25/2021  ? HCT 32.3 (L) 05/25/2021  ? MCV 88.0 05/25/2021  ? PLT 260 05/25/2021  ? NEUTROABS 4.9 05/25/2021  ? ? ?ASSESSMENT & PLAN:  ?Malignant neoplasm of upper-outer quadrant of left breast in female, estrogen receptor negative ([place]) ?Screening mammogram detected 2 asymmetric areas with calcifications detected in March 2022.  October 2022 mammogram revealed left breast calcifications 1.8 cm at 1 o'clock position and a mass below the skin measuring 4 mm.  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  ?04/11/20: Left Lumpectomy: Grade 3 IDC 0.6 cm with DCIS, Ant Margins pos for DCIS, LN Neg, Triple Negative   ?  ?Treatment plan: ?1.  Adjuvant chemo with Taxotere and Cytoxan every 3 weeks x4 completed  05/25/2021 ?2. adjuvant radiation 07/01/2021-07/27/2021 ?------------------------------------------------------------------------------------------------------------------------- ?Current treatment: Surveillance ?Fatigue from radiation and prior chemotherapy ?Alopecia ? ?No neuropathy ?  ?Return to clinic in 3 months for survivorship care plan visit after that we can see her once a year ? ? ? ?No orders of the defined types were placed in this encounter. ? ?The patient has a good understanding of the overall plan. she agrees with it. she will call with any  problems that may develop before the next visit here. ?Total time spent: 30 mins including face to face time and time spent for planning, charting and co-ordination of care ? ? Harriette Ohara, MD ?07/17/21 ? ? ? I Gardiner Coins am scribing for Dr. Lindi Adie ? ?I have reviewed the above documentation for accuracy and completeness, and I agree with the above. ?  ?

## 2021-07-03 NOTE — Progress Notes (Signed)
Pt here for patient teaching.  Pt given Radiation and You booklet, skin care instructions, and Radiaplex.  Patient was advised to obtain over the counter aluminum free deodorant.  Reviewed areas of pertinence such as fatigue, hair loss, skin changes, breast tenderness, and breast swelling. Pt able to give teach back of to pat skin and use unscented/gentle soap,apply Radiaplex bid, avoid applying anything to skin within 4 hours of treatment, avoid wearing an under wire bra, and to use an electric razor if they must shave. Pt verbalizes understanding of information given and will contact nursing with any questions or concerns.   ?

## 2021-07-06 ENCOUNTER — Ambulatory Visit
Admission: RE | Admit: 2021-07-06 | Discharge: 2021-07-06 | Disposition: A | Discharge status: Home / Self Care | Payer: Managed Care, Other (non HMO) | Source: Ambulatory Visit | Attending: Radiation Oncology | Admitting: Radiation Oncology

## 2021-07-06 ENCOUNTER — Other Ambulatory Visit: Payer: Self-pay

## 2021-07-06 DIAGNOSIS — C50412 Malignant neoplasm of upper-outer quadrant of left female breast: Secondary | ICD-10-CM | POA: Diagnosis not present

## 2021-07-06 LAB — RAD ONC ARIA SESSION SUMMARY
Course Elapsed Days: 6
Plan Fractions Treated to Date: 2
Plan Prescribed Dose Per Fraction: 2.66 Gy
Plan Total Fractions Prescribed: 13
Plan Total Prescribed Dose: 34.58 Gy
Reference Point Dosage Given to Date: 13.3 Gy
Reference Point Session Dosage Given: 2.66 Gy
Session Number: 4

## 2021-07-07 ENCOUNTER — Other Ambulatory Visit: Payer: Self-pay

## 2021-07-07 ENCOUNTER — Ambulatory Visit
Admission: RE | Admit: 2021-07-07 | Discharge: 2021-07-07 | Disposition: A | Discharge status: Home / Self Care | Payer: Managed Care, Other (non HMO) | Source: Ambulatory Visit | Attending: Radiation Oncology | Admitting: Radiation Oncology

## 2021-07-07 DIAGNOSIS — C50412 Malignant neoplasm of upper-outer quadrant of left female breast: Secondary | ICD-10-CM | POA: Diagnosis not present

## 2021-07-07 LAB — RAD ONC ARIA SESSION SUMMARY
Course Elapsed Days: 7
Plan Fractions Treated to Date: 3
Plan Prescribed Dose Per Fraction: 2.66 Gy
Plan Total Fractions Prescribed: 13
Plan Total Prescribed Dose: 34.58 Gy
Reference Point Dosage Given to Date: 15.96 Gy
Reference Point Session Dosage Given: 2.66 Gy
Session Number: 5

## 2021-07-08 ENCOUNTER — Other Ambulatory Visit: Payer: Self-pay

## 2021-07-08 ENCOUNTER — Ambulatory Visit
Admission: RE | Admit: 2021-07-08 | Discharge: 2021-07-08 | Disposition: A | Discharge status: Home / Self Care | Payer: Managed Care, Other (non HMO) | Source: Ambulatory Visit | Attending: Radiation Oncology | Admitting: Radiation Oncology

## 2021-07-08 DIAGNOSIS — C50412 Malignant neoplasm of upper-outer quadrant of left female breast: Secondary | ICD-10-CM | POA: Diagnosis not present

## 2021-07-08 LAB — RAD ONC ARIA SESSION SUMMARY
Course Elapsed Days: 8
Plan Fractions Treated to Date: 4
Plan Prescribed Dose Per Fraction: 2.66 Gy
Plan Total Fractions Prescribed: 13
Plan Total Prescribed Dose: 34.58 Gy
Reference Point Dosage Given to Date: 18.62 Gy
Reference Point Session Dosage Given: 2.66 Gy
Session Number: 6

## 2021-07-09 ENCOUNTER — Ambulatory Visit
Admission: RE | Admit: 2021-07-09 | Discharge: 2021-07-09 | Disposition: A | Discharge status: Home / Self Care | Payer: Managed Care, Other (non HMO) | Source: Ambulatory Visit | Attending: Radiation Oncology | Admitting: Radiation Oncology

## 2021-07-09 ENCOUNTER — Other Ambulatory Visit: Payer: Self-pay

## 2021-07-09 DIAGNOSIS — C50412 Malignant neoplasm of upper-outer quadrant of left female breast: Secondary | ICD-10-CM | POA: Diagnosis not present

## 2021-07-09 LAB — RAD ONC ARIA SESSION SUMMARY
Course Elapsed Days: 9
Plan Fractions Treated to Date: 5
Plan Prescribed Dose Per Fraction: 2.66 Gy
Plan Total Fractions Prescribed: 13
Plan Total Prescribed Dose: 34.58 Gy
Reference Point Dosage Given to Date: 21.28 Gy
Reference Point Session Dosage Given: 2.66 Gy
Session Number: 7

## 2021-07-10 ENCOUNTER — Other Ambulatory Visit: Payer: Self-pay

## 2021-07-10 ENCOUNTER — Ambulatory Visit
Admission: RE | Admit: 2021-07-10 | Discharge: 2021-07-10 | Disposition: A | Discharge status: Home / Self Care | Payer: Managed Care, Other (non HMO) | Source: Ambulatory Visit | Attending: Radiation Oncology | Admitting: Radiation Oncology

## 2021-07-10 DIAGNOSIS — C50412 Malignant neoplasm of upper-outer quadrant of left female breast: Secondary | ICD-10-CM | POA: Diagnosis not present

## 2021-07-10 LAB — RAD ONC ARIA SESSION SUMMARY
Course Elapsed Days: 10
Plan Fractions Treated to Date: 6
Plan Prescribed Dose Per Fraction: 2.66 Gy
Plan Total Fractions Prescribed: 13
Plan Total Prescribed Dose: 34.58 Gy
Reference Point Dosage Given to Date: 23.94 Gy
Reference Point Session Dosage Given: 2.66 Gy
Session Number: 8

## 2021-07-13 ENCOUNTER — Other Ambulatory Visit: Payer: Self-pay

## 2021-07-13 ENCOUNTER — Ambulatory Visit
Admission: RE | Admit: 2021-07-13 | Discharge: 2021-07-13 | Disposition: A | Discharge status: Home / Self Care | Payer: Managed Care, Other (non HMO) | Source: Ambulatory Visit | Attending: Radiation Oncology | Admitting: Radiation Oncology

## 2021-07-13 DIAGNOSIS — R5383 Other fatigue: Secondary | ICD-10-CM | POA: Diagnosis not present

## 2021-07-13 DIAGNOSIS — Z510 Encounter for antineoplastic radiation therapy: Secondary | ICD-10-CM | POA: Insufficient documentation

## 2021-07-13 DIAGNOSIS — Z923 Personal history of irradiation: Secondary | ICD-10-CM | POA: Diagnosis not present

## 2021-07-13 DIAGNOSIS — Z170 Estrogen receptor positive status [ER+]: Secondary | ICD-10-CM | POA: Insufficient documentation

## 2021-07-13 DIAGNOSIS — Z9221 Personal history of antineoplastic chemotherapy: Secondary | ICD-10-CM | POA: Diagnosis not present

## 2021-07-13 DIAGNOSIS — Z79899 Other long term (current) drug therapy: Secondary | ICD-10-CM | POA: Diagnosis not present

## 2021-07-13 DIAGNOSIS — Z171 Estrogen receptor negative status [ER-]: Secondary | ICD-10-CM | POA: Diagnosis not present

## 2021-07-13 DIAGNOSIS — C50412 Malignant neoplasm of upper-outer quadrant of left female breast: Secondary | ICD-10-CM | POA: Insufficient documentation

## 2021-07-13 LAB — RAD ONC ARIA SESSION SUMMARY
Course Elapsed Days: 13
Plan Fractions Treated to Date: 7
Plan Prescribed Dose Per Fraction: 2.66 Gy
Plan Total Fractions Prescribed: 13
Plan Total Prescribed Dose: 34.58 Gy
Reference Point Dosage Given to Date: 26.6 Gy
Reference Point Session Dosage Given: 2.66 Gy
Session Number: 9

## 2021-07-14 ENCOUNTER — Other Ambulatory Visit: Payer: Self-pay

## 2021-07-14 ENCOUNTER — Ambulatory Visit
Admission: RE | Admit: 2021-07-14 | Discharge: 2021-07-14 | Disposition: A | Discharge status: Home / Self Care | Payer: Managed Care, Other (non HMO) | Source: Ambulatory Visit | Attending: Radiation Oncology | Admitting: Radiation Oncology

## 2021-07-14 DIAGNOSIS — C50412 Malignant neoplasm of upper-outer quadrant of left female breast: Secondary | ICD-10-CM | POA: Diagnosis not present

## 2021-07-14 LAB — RAD ONC ARIA SESSION SUMMARY
Course Elapsed Days: 14
Plan Fractions Treated to Date: 8
Plan Prescribed Dose Per Fraction: 2.66 Gy
Plan Total Fractions Prescribed: 13
Plan Total Prescribed Dose: 34.58 Gy
Reference Point Dosage Given to Date: 29.26 Gy
Reference Point Session Dosage Given: 2.66 Gy
Session Number: 10

## 2021-07-15 ENCOUNTER — Ambulatory Visit
Admission: RE | Admit: 2021-07-15 | Discharge: 2021-07-15 | Disposition: A | Discharge status: Home / Self Care | Payer: Managed Care, Other (non HMO) | Source: Ambulatory Visit | Attending: Radiation Oncology | Admitting: Radiation Oncology

## 2021-07-15 ENCOUNTER — Other Ambulatory Visit: Payer: Self-pay

## 2021-07-15 DIAGNOSIS — C50412 Malignant neoplasm of upper-outer quadrant of left female breast: Secondary | ICD-10-CM | POA: Diagnosis not present

## 2021-07-15 LAB — RAD ONC ARIA SESSION SUMMARY
Course Elapsed Days: 15
Plan Fractions Treated to Date: 9
Plan Prescribed Dose Per Fraction: 2.66 Gy
Plan Total Fractions Prescribed: 13
Plan Total Prescribed Dose: 34.58 Gy
Reference Point Dosage Given to Date: 31.92 Gy
Reference Point Session Dosage Given: 2.66 Gy
Session Number: 11

## 2021-07-16 ENCOUNTER — Other Ambulatory Visit: Payer: Self-pay

## 2021-07-16 ENCOUNTER — Ambulatory Visit
Admission: RE | Admit: 2021-07-16 | Discharge: 2021-07-16 | Disposition: A | Discharge status: Home / Self Care | Payer: Managed Care, Other (non HMO) | Source: Ambulatory Visit | Attending: Radiation Oncology | Admitting: Radiation Oncology

## 2021-07-16 DIAGNOSIS — C50412 Malignant neoplasm of upper-outer quadrant of left female breast: Secondary | ICD-10-CM | POA: Diagnosis not present

## 2021-07-16 LAB — RAD ONC ARIA SESSION SUMMARY
Course Elapsed Days: 16
Plan Fractions Treated to Date: 10
Plan Prescribed Dose Per Fraction: 2.66 Gy
Plan Total Fractions Prescribed: 13
Plan Total Prescribed Dose: 34.58 Gy
Reference Point Dosage Given to Date: 34.58 Gy
Reference Point Session Dosage Given: 2.66 Gy
Session Number: 12

## 2021-07-17 ENCOUNTER — Ambulatory Visit: Payer: Managed Care, Other (non HMO) | Admitting: Radiation Oncology

## 2021-07-17 ENCOUNTER — Inpatient Hospital Stay: Payer: Managed Care, Other (non HMO) | Attending: Hematology and Oncology | Admitting: Hematology and Oncology

## 2021-07-17 ENCOUNTER — Other Ambulatory Visit: Payer: Self-pay

## 2021-07-17 ENCOUNTER — Ambulatory Visit
Admission: RE | Admit: 2021-07-17 | Discharge: 2021-07-17 | Disposition: A | Discharge status: Home / Self Care | Payer: Managed Care, Other (non HMO) | Source: Ambulatory Visit | Attending: Radiation Oncology | Admitting: Radiation Oncology

## 2021-07-17 DIAGNOSIS — C50412 Malignant neoplasm of upper-outer quadrant of left female breast: Secondary | ICD-10-CM | POA: Diagnosis not present

## 2021-07-17 DIAGNOSIS — R5383 Other fatigue: Secondary | ICD-10-CM | POA: Insufficient documentation

## 2021-07-17 DIAGNOSIS — Z510 Encounter for antineoplastic radiation therapy: Secondary | ICD-10-CM | POA: Insufficient documentation

## 2021-07-17 DIAGNOSIS — Z923 Personal history of irradiation: Secondary | ICD-10-CM | POA: Insufficient documentation

## 2021-07-17 DIAGNOSIS — Z171 Estrogen receptor negative status [ER-]: Secondary | ICD-10-CM | POA: Diagnosis not present

## 2021-07-17 DIAGNOSIS — Z79899 Other long term (current) drug therapy: Secondary | ICD-10-CM | POA: Insufficient documentation

## 2021-07-17 DIAGNOSIS — Z9221 Personal history of antineoplastic chemotherapy: Secondary | ICD-10-CM | POA: Insufficient documentation

## 2021-07-17 LAB — RAD ONC ARIA SESSION SUMMARY
Course Elapsed Days: 17
Plan Fractions Treated to Date: 11
Plan Prescribed Dose Per Fraction: 2.66 Gy
Plan Total Fractions Prescribed: 13
Plan Total Prescribed Dose: 34.58 Gy
Reference Point Dosage Given to Date: 37.24 Gy
Reference Point Session Dosage Given: 2.66 Gy
Session Number: 13

## 2021-07-17 NOTE — Assessment & Plan Note (Signed)
Screening mammogram detected 2 asymmetric areas with calcifications detected in March 2022. ?October 2022 mammogram revealed left breast calcifications 1.8 cm at 1 o'clock position and a mass below the skin measuring 4 mm. 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 ?04/11/20:?Left Lumpectomy: Grade 3 IDC 0.6 cm with DCIS, Ant Margins pos for DCIS, LN Neg, Triple Negative?? ?? ?Treatment plan: ?1.??Adjuvant chemo?with Taxotere and Cytoxan every 3 weeks x4 completed 05/25/2021 ?2.?adjuvant radiation 07/01/2021-07/27/2021 ?------------------------------------------------------------------------------------------------------------------------- ?Current treatment:?Surveillance ?? ?No neuropathy ?Monitoring blood counts closely ?Return to clinic in 3 months for survivorship care plan visit after that we can see her once a year ?

## 2021-07-20 ENCOUNTER — Telehealth: Payer: Self-pay | Admitting: Hematology and Oncology

## 2021-07-20 ENCOUNTER — Other Ambulatory Visit: Payer: Self-pay

## 2021-07-20 ENCOUNTER — Ambulatory Visit
Admission: RE | Admit: 2021-07-20 | Discharge: 2021-07-20 | Disposition: A | Discharge status: Home / Self Care | Payer: Managed Care, Other (non HMO) | Source: Ambulatory Visit | Attending: Radiation Oncology | Admitting: Radiation Oncology

## 2021-07-20 DIAGNOSIS — C50412 Malignant neoplasm of upper-outer quadrant of left female breast: Secondary | ICD-10-CM | POA: Diagnosis not present

## 2021-07-20 LAB — RAD ONC ARIA SESSION SUMMARY
Course Elapsed Days: 20
Plan Fractions Treated to Date: 12
Plan Prescribed Dose Per Fraction: 2.66 Gy
Plan Total Fractions Prescribed: 13
Plan Total Prescribed Dose: 34.58 Gy
Reference Point Dosage Given to Date: 39.9 Gy
Reference Point Session Dosage Given: 2.66 Gy
Session Number: 14

## 2021-07-20 NOTE — Telephone Encounter (Signed)
Per 5/5 los called and spoke to pt about SCP visit  pt confirmed appointment  ?

## 2021-07-21 ENCOUNTER — Other Ambulatory Visit: Payer: Self-pay

## 2021-07-21 ENCOUNTER — Ambulatory Visit
Admission: RE | Admit: 2021-07-21 | Discharge: 2021-07-21 | Disposition: A | Discharge status: Home / Self Care | Payer: Managed Care, Other (non HMO) | Source: Ambulatory Visit | Attending: Radiation Oncology | Admitting: Radiation Oncology

## 2021-07-21 DIAGNOSIS — C50412 Malignant neoplasm of upper-outer quadrant of left female breast: Secondary | ICD-10-CM | POA: Diagnosis not present

## 2021-07-21 LAB — RAD ONC ARIA SESSION SUMMARY
Course Elapsed Days: 21
Plan Fractions Treated to Date: 13
Plan Prescribed Dose Per Fraction: 2.66 Gy
Plan Total Fractions Prescribed: 13
Plan Total Prescribed Dose: 34.58 Gy
Reference Point Dosage Given to Date: 42.56 Gy
Reference Point Session Dosage Given: 2.66 Gy
Session Number: 15

## 2021-07-22 ENCOUNTER — Other Ambulatory Visit: Payer: Self-pay

## 2021-07-22 ENCOUNTER — Ambulatory Visit
Admission: RE | Admit: 2021-07-22 | Discharge: 2021-07-22 | Disposition: A | Discharge status: Home / Self Care | Payer: Managed Care, Other (non HMO) | Source: Ambulatory Visit | Attending: Radiation Oncology | Admitting: Radiation Oncology

## 2021-07-22 DIAGNOSIS — C50412 Malignant neoplasm of upper-outer quadrant of left female breast: Secondary | ICD-10-CM | POA: Diagnosis not present

## 2021-07-22 LAB — RAD ONC ARIA SESSION SUMMARY
Course Elapsed Days: 22
Plan Fractions Treated to Date: 1
Plan Prescribed Dose Per Fraction: 2 Gy
Plan Total Fractions Prescribed: 4
Plan Total Prescribed Dose: 8 Gy
Reference Point Dosage Given to Date: 44.56 Gy
Reference Point Session Dosage Given: 2 Gy
Session Number: 16

## 2021-07-23 ENCOUNTER — Ambulatory Visit
Admission: RE | Admit: 2021-07-23 | Discharge: 2021-07-23 | Disposition: A | Discharge status: Home / Self Care | Payer: Managed Care, Other (non HMO) | Source: Ambulatory Visit | Attending: Radiation Oncology | Admitting: Radiation Oncology

## 2021-07-23 ENCOUNTER — Other Ambulatory Visit: Payer: Self-pay

## 2021-07-23 DIAGNOSIS — C50412 Malignant neoplasm of upper-outer quadrant of left female breast: Secondary | ICD-10-CM | POA: Diagnosis not present

## 2021-07-23 LAB — RAD ONC ARIA SESSION SUMMARY
Course Elapsed Days: 23
Plan Fractions Treated to Date: 2
Plan Prescribed Dose Per Fraction: 2 Gy
Plan Total Fractions Prescribed: 4
Plan Total Prescribed Dose: 8 Gy
Reference Point Dosage Given to Date: 46.56 Gy
Reference Point Session Dosage Given: 2 Gy
Session Number: 17

## 2021-07-24 ENCOUNTER — Ambulatory Visit: Payer: Managed Care, Other (non HMO)

## 2021-07-27 ENCOUNTER — Ambulatory Visit
Admission: RE | Admit: 2021-07-27 | Discharge: 2021-07-27 | Disposition: A | Discharge status: Home / Self Care | Payer: Managed Care, Other (non HMO) | Source: Ambulatory Visit | Attending: Radiation Oncology | Admitting: Radiation Oncology

## 2021-07-27 ENCOUNTER — Other Ambulatory Visit: Payer: Self-pay

## 2021-07-27 ENCOUNTER — Encounter: Payer: Self-pay | Admitting: *Deleted

## 2021-07-27 ENCOUNTER — Ambulatory Visit: Payer: Managed Care, Other (non HMO)

## 2021-07-27 DIAGNOSIS — C50412 Malignant neoplasm of upper-outer quadrant of left female breast: Secondary | ICD-10-CM

## 2021-07-27 LAB — RAD ONC ARIA SESSION SUMMARY
Course Elapsed Days: 27
Plan Fractions Treated to Date: 3
Plan Prescribed Dose Per Fraction: 2 Gy
Plan Total Fractions Prescribed: 4
Plan Total Prescribed Dose: 8 Gy
Reference Point Dosage Given to Date: 48.56 Gy
Reference Point Session Dosage Given: 2 Gy
Session Number: 18

## 2021-07-28 ENCOUNTER — Ambulatory Visit
Admission: RE | Admit: 2021-07-28 | Discharge: 2021-07-28 | Disposition: A | Discharge status: Home / Self Care | Payer: Managed Care, Other (non HMO) | Source: Ambulatory Visit | Attending: Radiation Oncology | Admitting: Radiation Oncology

## 2021-07-28 ENCOUNTER — Encounter: Payer: Self-pay | Admitting: Radiation Oncology

## 2021-07-28 ENCOUNTER — Other Ambulatory Visit: Payer: Self-pay

## 2021-07-28 DIAGNOSIS — C50412 Malignant neoplasm of upper-outer quadrant of left female breast: Secondary | ICD-10-CM | POA: Diagnosis not present

## 2021-07-28 LAB — RAD ONC ARIA SESSION SUMMARY
Course Elapsed Days: 28
Plan Fractions Treated to Date: 4
Plan Prescribed Dose Per Fraction: 2 Gy
Plan Total Fractions Prescribed: 4
Plan Total Prescribed Dose: 8 Gy
Reference Point Dosage Given to Date: 50.56 Gy
Reference Point Session Dosage Given: 2 Gy
Session Number: 19

## 2021-08-03 ENCOUNTER — Ambulatory Visit: Payer: Managed Care, Other (non HMO)

## 2021-08-05 ENCOUNTER — Encounter: Payer: Self-pay | Admitting: Hematology and Oncology

## 2021-08-05 NOTE — Progress Notes (Signed)
                                                                                                                                                             Patient Name: Leah Chan MRN: [id number] DOB: 1960-05-21 Referring Physician: [name] (Profile Not Attached) Date of Service: 07/28/2021 [facility] [place]                                                        End Of Treatment Note  Diagnoses: C50.412-Malignant neoplasm of upper-outer quadrant of left female breast  Cancer Staging: Stage IB, cT1bN0M0, grade 3, Triple negative invasive ductal carcinoma of the left breast  Intent: Curative  Radiation Treatment Dates: 06/30/2021 through 07/28/2021 Site Technique Total Dose (Gy) Dose per Fx (Gy) Completed Fx Beam Energies  Breast, Left: Breast_L 3D 42.56/42.56 2.66 16/16 6XFFF  Breast, Left: Breast_L_Bst 3D 8/8 2 4/4 6X, 10X   Narrative: The patient tolerated radiation therapy relatively well. She developed  anticipated skin changes in the treatment field.   Plan: The patient will receive a call in about one month from the radiation oncology department. She will continue follow up with Dr. Lindi Adie as well.   ________________________________________________    Carola Rhine, [facility]

## 2021-08-24 ENCOUNTER — Telehealth: Payer: Self-pay

## 2021-08-24 NOTE — Telephone Encounter (Signed)
Notified Patient of completion of Short Term Disability Forms. Fax transmission confirmation received. Copy of forms emailed to [email or web address] as requested. No other needs or concerns voiced at this time.

## 2021-08-27 NOTE — Progress Notes (Signed)
  Radiation Oncology         [phone]) [phone] ________________________________  Name: Leah Chan MRN: [id number]  Date of Service: 08/31/2021  DOB: September 26, 1960  Post Treatment Telephone Note  Diagnosis:   Stage IB, cT1bN0M0, grade 3, Triple negative invasive ductal carcinoma of the left breast  Intent: Curative  Radiation Treatment Dates: 06/30/2021 through 07/28/2021 Site Technique Total Dose (Gy) Dose per Fx (Gy) Completed Fx Beam Energies  Breast, Left: Breast_L 3D 42.56/42.56 2.66 16/16 6XFFF  Breast, Left: Breast_L_Bst 3D 8/8 2 4/4 6X, 10X   Narrative: The patient tolerated radiation therapy relatively well. She developed anticipated skin changes in the treatment field.   Impression/Plan: 1.  Stage IB, cT1bN0M0, grade 3, Triple negative invasive ductal carcinoma of the left breast. The patient has been doing well since completion of radiotherapy. We discussed that we would be happy to continue to follow her as needed, but she will also continue to follow up with Dr. Lindi Adie in medical oncology. She was counseled on skin care as well as measures to avoid sun exposure to this area.  2. Survivorship. We discussed the importance of survivorship evaluation and encouraged her to attend her upcoming visit with that clinic.      Carola Rhine, PAC

## 2021-08-31 ENCOUNTER — Ambulatory Visit
Admission: RE | Admit: 2021-08-31 | Discharge: 2021-08-31 | Disposition: A | Discharge status: Home / Self Care | Payer: Managed Care, Other (non HMO) | Source: Ambulatory Visit | Attending: Radiation Oncology | Admitting: Radiation Oncology

## 2021-08-31 DIAGNOSIS — Z171 Estrogen receptor negative status [ER-]: Secondary | ICD-10-CM

## 2021-09-01 ENCOUNTER — Other Ambulatory Visit: Payer: Self-pay | Admitting: Nurse Practitioner

## 2021-09-17 ENCOUNTER — Telehealth: Payer: Self-pay | Admitting: Adult Health

## 2021-09-17 NOTE — Telephone Encounter (Signed)
Rescheduled appointment per provider requested. Patient is aware of the changes made to her upcoming appointment.

## 2021-09-28 ENCOUNTER — Ambulatory Visit: Payer: Managed Care, Other (non HMO) | Attending: Surgery

## 2021-09-28 VITALS — Wt 125.2 lb

## 2021-09-28 DIAGNOSIS — Z483 Aftercare following surgery for neoplasm: Secondary | ICD-10-CM | POA: Insufficient documentation

## 2021-09-28 NOTE — Therapy (Addendum)
OUTPATIENT PHYSICAL THERAPY SOZO SCREENING NOTE   Patient Name: Leah Chan MRN: [id number] DOB:April 19, 1960, 61 y.o., female Today's Date: 09/28/2021  PCP: Merryl Hacker, No REFERRING PROVIDER: [name], MD   PT End of Session - 09/28/21 [phone]     Visit Number 2   # unchanged due to screen only   PT Start Time 7680    PT Stop Time 0842    PT Time Calculation (min) 9 min    Activity Tolerance Patient tolerated treatment well    Behavior During Therapy [facility] for tasks assessed/performed             Past Medical History:  Diagnosis Date   Anxiety    Asthma    Cancer ([place])    Family history of prostate cancer 01/22/2021   Family history of skin cancer 01/22/2021   Genetic testing 02/02/2021   Negative hereditary cancer genetic testing: no pathogenic variants detected in [place] Panel.  The report date is January 29, 2021.   The BRCAplus panel offered by [employer] and includes sequencing and deletion/duplication analysis for the following 8 genes: ATM, BRCA1, BRCA2, CDH1, CHEK2, PALB2, PTEN, and TP53.  Results of pan-cancer panel are pending.    Hypertension    Pneumonia 2010   Past Surgical History:  Procedure Laterality Date   BREAST LUMPECTOMY Right 2002   BREAST LUMPECTOMY WITH RADIOACTIVE SEED LOCALIZATION Left 02/09/2021   Procedure: LEFT BREAST LUMPECTOMY WITH RADIOACTIVE SEED LOCALIZATION (2 SEEDS);  Surgeon: [name], MD;  Location: [place];  Service: General;  Laterality: Left;   PORTACATH PLACEMENT Right 03/03/2021   Procedure: INSERTION PORT-A-CATH;  Surgeon: [name], MD;  Location: [place];  Service: General;  Laterality: Right;   SENTINEL NODE BIOPSY Left 02/09/2021   Procedure: LEFT AXILLARY SENTINEL LYMPH NODE BIOPSY;  Surgeon: [name], MD;  Location: [place];  Service: General;  Laterality: Left;   Patient Active Problem List   Diagnosis Date Noted   Port-A-Cath in place 03/23/2021   Genetic testing  02/02/2021   Family history of prostate cancer 01/22/2021   Family history of skin cancer 01/22/2021   Malignant neoplasm of upper-outer quadrant of left breast in female, estrogen receptor negative ([place]) 01/20/2021    REFERRING DIAG: left breast cancer at risk for lymphedema  THERAPY DIAG:  Aftercare following surgery for neoplasm  PERTINENT HISTORY: Patient was diagnosed on 12/25/2020 with left grade III invasive ductal carcinoma breast cancer. She underwent a left lumpectomy and sentinel node biopsy (2 negative nodes) on 02/09/2021. It is triple negative with a Ki67 of 15%  PRECAUTIONS: left UE Lymphedema risk, None  SUBJECTIVE: Pt returns for her 3 month L-Dex screen. :My port incision since removal is still sore. They had to pack it one time bc there was drainage present."  PAIN:  Are you having pain? No Port incision tender, though healed well.   SOZO SCREENING: Patient was assessed today using the SOZO machine to determine the lymphedema index score. This was compared to her baseline score. It was determined that she is within the recommended range when compared to her baseline and no further action is needed at this time. She will continue SOZO screenings. These are done every 3 months for 2 years post operatively followed by every 6 months for 2 years, and then annually. *Advised pt to apply cocoa butter and perform light scar tissue massage to port incision that is well healed (demo on pt). Also demo'd doorway stretch for her to add.  Pt able to verbalize good understanding.    L-DEX FLOWSHEETS - 09/28/21 0800       L-DEX LYMPHEDEMA SCREENING   Measurement Type Unilateral    L-DEX MEASUREMENT EXTREMITY Upper Extremity    POSITION  Standing    DOMINANT SIDE Right    At Risk Side Left    BASELINE SCORE (UNILATERAL) 1.8    L-DEX SCORE (UNILATERAL) 1.1    VALUE CHANGE (UNILAT) -0.7              [name], PTA 09/28/2021, 8:43 AM

## 2021-10-13 ENCOUNTER — Encounter: Payer: Self-pay | Admitting: *Deleted

## 2021-10-16 ENCOUNTER — Encounter: Payer: Managed Care, Other (non HMO) | Admitting: Adult Health

## 2021-10-16 ENCOUNTER — Other Ambulatory Visit: Payer: Self-pay

## 2021-10-16 ENCOUNTER — Inpatient Hospital Stay: Payer: Managed Care, Other (non HMO) | Attending: Hematology and Oncology | Admitting: Adult Health

## 2021-10-16 VITALS — BP 131/58 | HR 74 | Temp 97.5°F | Resp 18 | Ht 63.0 in | Wt 126.4 lb

## 2021-10-16 DIAGNOSIS — Z171 Estrogen receptor negative status [ER-]: Secondary | ICD-10-CM | POA: Insufficient documentation

## 2021-10-16 DIAGNOSIS — E2839 Other primary ovarian failure: Secondary | ICD-10-CM

## 2021-10-16 DIAGNOSIS — I10 Essential (primary) hypertension: Secondary | ICD-10-CM | POA: Insufficient documentation

## 2021-10-16 DIAGNOSIS — C50412 Malignant neoplasm of upper-outer quadrant of left female breast: Secondary | ICD-10-CM | POA: Diagnosis not present

## 2021-10-16 DIAGNOSIS — Z79899 Other long term (current) drug therapy: Secondary | ICD-10-CM | POA: Insufficient documentation

## 2021-10-16 NOTE — Progress Notes (Signed)
SURVIVORSHIP VISIT:   BRIEF ONCOLOGIC HISTORY:  Oncology History  Malignant neoplasm of upper-outer quadrant of left breast in female, estrogen receptor negative ([place])  01/20/2021 Initial Diagnosis   Screening mammogram detected 2 asymmetric areas with calcifications detected in March 2022.  October 2022 mammogram revealed left breast calcifications 1.8 cm at 1 o'clock position and a mass below the skin measuring 4 mm.  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 The report dates are January 29, 2021 and February 09, 2021.   The BRCAplus panel offered by [employer] and includes sequencing and deletion/duplication analysis for the following 8 genes: ATM, BRCA1, BRCA2, CDH1, CHEK2, PALB2, PTEN, and TP53.  The CustomNext-Cancer+RNAinsight panel offered by [employer] includes sequencing and rearrangement analysis for the following 52 genes:  APC, ATM, AXIN2, BAP1, BARD1, BMPR1A, BRCA1, BRCA2, BRIP1, CDH1, CDK4, CDKN2A, CHEK2CTNNA1, DICER1, MEN1, MLH1, MSH2, MSH3, MSH6, MUTYH, NBN, NF1, NTHL1, PALB2, PMS2, POT1, PTCH1, PTEN, RAD50, RAD51C, RAD51D, SDHA, SDHB, SDHC, SDHD, SMAD4, SMARCA4, STK11, SUFU, TP53, TSC1, TSC2 and VHL (sequencing and deletion/duplication); HOXB13, KIT, MITF, PDGFRA, POLD1 and POLE (sequencing only); EPCAM and GREM1 (deletion/duplication only). RNA data is routinely analyzed for use in variant interpretation for all genes.   02/09/2021 Surgery   Left Lumpectomy: Grade 3 IDC 0.6 cm with DCIS, Ant Margins pos for DCIS, LN Neg, Triple Negative   03/23/2021 - 05/25/2021 Chemotherapy   Patient is on Treatment Plan : BREAST TC q21d     06/30/2021 - 07/28/2021 Radiation Therapy   Site Technique Total Dose (Gy) Dose per Fx (Gy)  Completed Fx Beam Energies  Breast, Left: Breast_L 3D 42.56/42.56 2.66 16/16 6XFFF  Breast, Left: Breast_L_Bst 3D 8/8 2 4/4 6X, 10X       INTERVAL HISTORY:  Leah Chan to review her survivorship care plan detailing her treatment course for breast cancer, as well as monitoring long-term side effects of that treatment, education regarding health maintenance, screening, and overall wellness and health promotion.     Overall, Leah Chan reports feeling quite well.  She is not having any significant issues today.    REVIEW OF SYSTEMS:  Review of Systems  Constitutional:  Negative for appetite change, chills, fatigue, fever and unexpected weight change.  HENT:   Negative for hearing loss, lump/mass and trouble swallowing.   Eyes:  Negative for eye problems and icterus.  Respiratory:  Negative for chest tightness, cough and shortness of breath.   Cardiovascular:  Negative for chest pain, leg swelling and palpitations.  Gastrointestinal:  Negative for abdominal distention, abdominal pain, constipation, diarrhea, nausea and vomiting.  Endocrine: Negative for hot flashes.  Genitourinary:  Negative for difficulty urinating.   Musculoskeletal:  Negative for arthralgias.  Skin:  Negative for itching and rash.  Neurological:  Negative for dizziness, extremity weakness, headaches and numbness.  Hematological:  Negative for adenopathy. Does not bruise/bleed easily.  Psychiatric/Behavioral:  Negative for depression. The patient is not nervous/anxious.   Breast: Denies any new nodularity, masses, tenderness, nipple changes, or nipple discharge.   ONCOLOGY TREATMENT TEAM:  1. Surgeon:  Dr. Ninfa Linden at [facility] 2. Medical Oncologist: Dr. Lindi Adie  3. Radiation Oncologist: Dr. Lisbeth Renshaw    PAST MEDICAL/SURGICAL HISTORY:  Past Medical History:  Diagnosis Date   Anxiety    Asthma    Cancer (  [place])    Family history of prostate cancer 01/22/2021   Family history of skin cancer 01/22/2021    Genetic testing 02/02/2021   Negative hereditary cancer genetic testing: no pathogenic variants detected in [facility] BRCAPl[facility] Panel.  The report date is January 29, 2021.   The BRCAplus panel offered by [employer] and includes sequencing and deletion/duplication analysis for the following 8 genes: ATM, BRCA1, BRCA2, CDH1, CHEK2, PALB2, PTEN, and TP53.  Results of pan-cancer panel are pending.    Hypertension    Pneumonia 2010   Past Surgical History:  Procedure Laterality Date   BREAST LUMPECTOMY Right 2002   BREAST LUMPECTOMY WITH RADIOACTIVE SEED LOCALIZATION Left 02/09/2021   Procedure: LEFT BREAST LUMPECTOMY WITH RADIOACTIVE SEED LOCALIZATION (2 SEEDS);  Surgeon: [name], MD;  Location: [place];  Service: General;  Laterality: Left;   PORTACATH PLACEMENT Right 03/03/2021   Procedure: INSERTION PORT-A-CATH;  Surgeon: [name], MD;  Location: [place];  Service: General;  Laterality: Right;   SENTINEL NODE BIOPSY Left 02/09/2021   Procedure: LEFT AXILLARY SENTINEL LYMPH NODE BIOPSY;  Surgeon: [name], MD;  Location: [place];  Service: General;  Laterality: Left;     ALLERGIES:  Allergies  Allergen Reactions   Latex Itching    Makes skin red    Sulfa Antibiotics Nausea And Vomiting     CURRENT MEDICATIONS:  Outpatient Encounter Medications as of 10/16/2021  Medication Sig   acetaminophen (TYLENOL) 500 MG tablet Take 500 mg by mouth as needed for mild pain.   escitalopram (LEXAPRO) 10 MG tablet Take 10 mg by mouth daily.   GARLIC PO Take 1 capsule by mouth 4 (four) times a week.   hydrochlorothiazide (HYDRODIURIL) 25 MG tablet Take 50 mg by mouth daily.   hydrOXYzine (ATARAX/VISTARIL) 25 MG tablet Take 50 mg by mouth at bedtime.   Multiple Minerals (CALCIUM-MAGNESIUM-ZINC) TABS Take 1 tablet by mouth daily.   pravastatin (PRAVACHOL) 10 MG tablet Take 10 mg by mouth at bedtime.   calcium carbonate (TUMS - DOSED IN MG ELEMENTAL CALCIUM)  500 MG chewable tablet Chew 1 tablet by mouth daily as needed for indigestion or heartburn. (Patient not taking: Reported on 10/16/2021)   [DISCONTINUED] prochlorperazine (COMPAZINE) 10 MG tablet Take 1 tablet (10 mg total) by mouth every 6 (six) hours as needed for nausea or vomiting. (Patient not taking: Reported on 10/16/2021)   No facility-administered encounter medications on file as of 10/16/2021.     ONCOLOGIC FAMILY HISTORY:  Family History  Problem Relation Age of Onset   Prostate cancer Father        mets; d. 72   Prostate cancer Maternal Uncle        dx after 73     SOCIAL HISTORY:  Social History   Socioeconomic History   Marital status: Married    Spouse name: Not on file   Number of children: Not on file   Years of education: Not on file   Highest education level: Not on file  Occupational History   Not on file  Tobacco Use   Smoking status: Never   Smokeless tobacco: Not on file  Vaping Use   Vaping Use: Never used  Substance and Sexual Activity   Alcohol use: Never   Drug use: Never   Sexual activity: Not on file  Other Topics Concern   Not on file  Social History Narrative   Not on file   Social Determinants of Health   Financial Resource  Strain: High Risk (06/02/2021)   Overall Financial Resource Strain (CARDIA)    Difficulty of Paying Living Expenses: Very hard  Food Insecurity: Not on file  Transportation Needs: Not on file  Physical Activity: Not on file  Stress: Not on file  Social Connections: Not on file  Intimate Partner Violence: Not on file     OBSERVATIONS/OBJECTIVE:  BP (!) 131/58 (BP Location: Right Arm, Patient Position: Sitting)   Pulse 74   Temp (!) 97.5 F (36.4 C) (Tympanic)   Resp 18   Ht '5\' 3"'  (1.6 m)   Wt 126 lb 6.4 oz (57.3 kg)   SpO2 99%   BMI 22.39 kg/m  GENERAL: Patient is a well appearing female in no acute distress HEENT:  Sclerae anicteric.  Oropharynx clear and moist. No ulcerations or evidence of oropharyngeal  candidiasis. Neck is supple.  NODES:  No cervical, supraclavicular, or axillary lymphadenopathy palpated.  BREAST EXAM: Left breast status postlumpectomy and radiation no sign of local recurrence right breast is benign. LUNGS:  Clear to auscultation bilaterally.  No wheezes or rhonchi. HEART:  Regular rate and rhythm. No murmur appreciated. ABDOMEN:  Soft, nontender.  Positive, normoactive bowel sounds. No organomegaly palpated. MSK:  No focal spinal tenderness to palpation. Full range of motion bilaterally in the upper extremities. EXTREMITIES:  No peripheral edema.   SKIN:  Clear with no obvious rashes or skin changes. No nail dyscrasia. NEURO:  Nonfocal. Well oriented.  Appropriate affect.  LABORATORY DATA:  None for this visit.  DIAGNOSTIC IMAGING:  None for this visit.      ASSESSMENT AND PLAN:  Ms.. Chan is a pleasant 60 y.o. female with Stage 1B left breast invasive ductal carcinoma, ER+/PR+/HER2-, diagnosed in November 2022, treated with lumpectomy, adjuvant chemotherapy, and adjuvant radiation therapy.  She presents to the Survivorship Clinic for our initial meeting and routine follow-up post-completion of treatment for breast cancer.    1. Stage 1B left breast cancer:  Leah Chan is continuing to recover from definitive treatment for breast cancer. She will follow-up with her medical oncologist, Dr. Lindi Adie in 6 months with history and physical exam per surveillance protocol. Her mammogram is due 01/2022; orders placed today. Today, a comprehensive survivorship care plan and treatment summary was reviewed with the patient today detailing her breast cancer diagnosis, treatment course, potential late/long-term effects of treatment, appropriate follow-up care with recommendations for the future, and patient education resources.  A copy of this summary, along with a letter will be sent to the patient's primary care provider via mail/fax/In Basket message after today's visit.    2.  Bone health: She is overdue for bone density testing therefore I placed orders for this to be completed when she undergoes her mammography in November.  She was given education on specific activities to promote bone health.  3. Cancer screening:  Due to Leah Chan's history and her age, she should receive screening for skin cancers, colon cancer, and gynecologic cancers.  The information and recommendations are listed on the patient's comprehensive care plan/treatment summary and were reviewed in detail with the patient.    4. Health maintenance and wellness promotion: Ms. Chan was encouraged to consume 5-7 servings of fruits and vegetables per day. We reviewed the "Nutrition Rainbow" handout.  She was also encouraged to engage in moderate to vigorous exercise for 30 minutes per day most days of the week. We discussed the [employer] fitness program, which is designed for cancer survivors to help them become more  physically fit after cancer treatments.  She was instructed to limit her alcohol consumption and continue to abstain from tobacco use.     5. Support services/counseling: It is not uncommon for this period of the patient's cancer care trajectory to be one of many emotions and stressors.  She was given information regarding our available services and encouraged to contact me with any questions or for help enrolling in any of our support group/programs.    Follow up instructions:    -Return to cancer center in 6 months for f/u with Dr. Lindi Adie  -Mammogram due in 01/2022 -Bone Density due in 01/2022 -She is welcome to return back to the Survivorship Clinic at any time; no additional follow-up needed at this time.  -Consider referral back to survivorship as a long-term survivor for continued surveillance  The patient was provided an opportunity to ask questions and all were answered. The patient agreed with the plan and demonstrated an understanding of the instructions.   Total  encounter time:40 minutes*in face-to-face visit time, chart review, lab review, care coordination, order entry, and documentation of the encounter time.    Wilber Bihari, NP 10/19/21 2:15 PM Medical Oncology and Hematology [facility] [place], [place] [address] Tel. [phone]    Fax. [phone]  *Total Encounter Time as defined by the Centers for Medicare and Medicaid Services includes, in addition to the face-to-face time of a patient visit (documented in the note above) non-face-to-face time: obtaining and reviewing outside history, ordering and reviewing medications, tests or procedures, care coordination (communications with other health care professionals or caregivers) and documentation in the medical record.

## 2021-10-19 ENCOUNTER — Encounter: Payer: Self-pay | Admitting: Adult Health

## 2022-01-04 ENCOUNTER — Ambulatory Visit: Payer: Commercial Managed Care - HMO | Attending: Surgery

## 2022-01-04 VITALS — Wt 125.4 lb

## 2022-01-04 DIAGNOSIS — Z483 Aftercare following surgery for neoplasm: Secondary | ICD-10-CM | POA: Insufficient documentation

## 2022-01-04 NOTE — Therapy (Signed)
OUTPATIENT PHYSICAL THERAPY SOZO SCREENING NOTE   Patient Name: Leah Chan MRN: [id number] DOB:[birth year], 61 y.o., female Today's Date: 01/04/2022  PCP: Merryl Hacker, No REFERRING PROVIDER: [name], MD   PT End of Session - 01/04/22 0941     Visit Number 2   # unchanged due to screen only   PT Start Time 0939    PT Stop Time 0944    PT Time Calculation (min) 5 min    Activity Tolerance Patient tolerated treatment well    Behavior During Therapy [facility] for tasks assessed/performed             Past Medical History:  Diagnosis Date   Anxiety    Asthma    Cancer ([place])    Family history of prostate cancer 01/22/2021   Family history of skin cancer 01/22/2021   Genetic testing 02/02/2021   Negative hereditary cancer genetic testing: no pathogenic variants detected in [place] Panel.  The report date is January 29, 2021.   The BRCAplus panel offered by [employer] and includes sequencing and deletion/duplication analysis for the following 8 genes: ATM, BRCA1, BRCA2, CDH1, CHEK2, PALB2, PTEN, and TP53.  Results of pan-cancer panel are pending.    Hypertension    Pneumonia 2010   Past Surgical History:  Procedure Laterality Date   BREAST LUMPECTOMY Right 2002   BREAST LUMPECTOMY WITH RADIOACTIVE SEED LOCALIZATION Left 02/09/2021   Procedure: LEFT BREAST LUMPECTOMY WITH RADIOACTIVE SEED LOCALIZATION (2 SEEDS);  Surgeon: [name], MD;  Location: [place];  Service: General;  Laterality: Left;   PORTACATH PLACEMENT Right 03/03/2021   Procedure: INSERTION PORT-A-CATH;  Surgeon: [name], MD;  Location: [place];  Service: General;  Laterality: Right;   SENTINEL NODE BIOPSY Left 02/09/2021   Procedure: LEFT AXILLARY SENTINEL LYMPH NODE BIOPSY;  Surgeon: [name], MD;  Location: [place];  Service: General;  Laterality: Left;   Patient Active Problem List   Diagnosis Date Noted   Genetic testing 02/02/2021   Family history of  prostate cancer 01/22/2021   Family history of skin cancer 01/22/2021   Malignant neoplasm of upper-outer quadrant of left breast in female, estrogen receptor negative ([place]) 01/20/2021    REFERRING DIAG: left breast cancer at risk for lymphedema  THERAPY DIAG: Aftercare following surgery for neoplasm  PERTINENT HISTORY: Patient was diagnosed on 12/25/2020 with left grade III invasive ductal carcinoma breast cancer. She underwent a left lumpectomy and sentinel node biopsy (2 negative nodes) on 02/09/2021. It is triple negative with a Ki67 of 15%  PRECAUTIONS: left UE Lymphedema risk, None  SUBJECTIVE: Pt returns for her 3 month L-Dex screen.  PAIN:  Are you having pain? No Port incision tender, though healed well.   SOZO SCREENING: Patient was assessed today using the SOZO machine to determine the lymphedema index score. This was compared to her baseline score. It was determined that she is within the recommended range when compared to her baseline and no further action is needed at this time. She will continue SOZO screenings. These are done every 3 months for 2 years post operatively followed by every 6 months for 2 years, and then annually. *Advised pt to apply cocoa butter and perform light scar tissue massage to port incision that is well healed (demo on pt). Also demo'd doorway stretch for her to add. Pt able to verbalize good understanding.    L-DEX FLOWSHEETS - 01/04/22 0900       L-DEX LYMPHEDEMA SCREENING   Measurement Type  Unilateral    L-DEX MEASUREMENT EXTREMITY Upper Extremity    POSITION  Standing    DOMINANT SIDE Right    At Risk Side Left    BASELINE SCORE (UNILATERAL) 1.8    L-DEX SCORE (UNILATERAL) -0.2    VALUE CHANGE (UNILAT) -2              [name], PTA 01/04/2022, 9:43 AM

## 2022-02-09 ENCOUNTER — Encounter: Payer: Self-pay | Admitting: Adult Health

## 2022-03-17 ENCOUNTER — Encounter: Payer: Self-pay | Admitting: Gastroenterology

## 2022-04-05 ENCOUNTER — Ambulatory Visit: Payer: BLUE CROSS/BLUE SHIELD | Attending: Surgery

## 2022-04-05 VITALS — Wt 129.5 lb

## 2022-04-05 DIAGNOSIS — Z483 Aftercare following surgery for neoplasm: Secondary | ICD-10-CM | POA: Insufficient documentation

## 2022-04-05 NOTE — Therapy (Signed)
OUTPATIENT PHYSICAL THERAPY SOZO SCREENING NOTE   Patient Name: Leah Chan MRN: [id number] DOB:[birth year], 62 y.o., female Today's Date: 04/05/2022  PCP: Merryl Hacker, No REFERRING PROVIDER: [name], MD   PT End of Session - 04/05/22 1555     Visit Number 2   # unchanged due to screen only   PT Start Time 1324    PT Stop Time 1559    PT Time Calculation (min) 5 min    Activity Tolerance Patient tolerated treatment well    Behavior During Therapy [facility] for tasks assessed/performed             Past Medical History:  Diagnosis Date   Anxiety    Asthma    Cancer ([place])    Family history of prostate cancer 01/22/2021   Family history of skin cancer 01/22/2021   Genetic testing 02/02/2021   Negative hereditary cancer genetic testing: no pathogenic variants detected in [place] Panel.  The report date is January 29, 2021.   The BRCAplus panel offered by [employer] and includes sequencing and deletion/duplication analysis for the following 8 genes: ATM, BRCA1, BRCA2, CDH1, CHEK2, PALB2, PTEN, and TP53.  Results of pan-cancer panel are pending.    Hypertension    Pneumonia 2010   Past Surgical History:  Procedure Laterality Date   BREAST LUMPECTOMY Right 2002   BREAST LUMPECTOMY WITH RADIOACTIVE SEED LOCALIZATION Left 02/09/2021   Procedure: LEFT BREAST LUMPECTOMY WITH RADIOACTIVE SEED LOCALIZATION (2 SEEDS);  Surgeon: [name], MD;  Location: [place];  Service: General;  Laterality: Left;   PORTACATH PLACEMENT Right 03/03/2021   Procedure: INSERTION PORT-A-CATH;  Surgeon: [name], MD;  Location: [place];  Service: General;  Laterality: Right;   SENTINEL NODE BIOPSY Left 02/09/2021   Procedure: LEFT AXILLARY SENTINEL LYMPH NODE BIOPSY;  Surgeon: [name], MD;  Location: [place];  Service: General;  Laterality: Left;   Patient Active Problem List   Diagnosis Date Noted   Genetic testing 02/02/2021   Family history of  prostate cancer 01/22/2021   Family history of skin cancer 01/22/2021   Malignant neoplasm of upper-outer quadrant of left breast in female, estrogen receptor negative ([place]) 01/20/2021    REFERRING DIAG: left breast cancer at risk for lymphedema  THERAPY DIAG: Aftercare following surgery for neoplasm  PERTINENT HISTORY: Patient was diagnosed on 12/25/2020 with left grade III invasive ductal carcinoma breast cancer. She underwent a left lumpectomy and sentinel node biopsy (2 negative nodes) on 02/09/2021. It is triple negative with a Ki67 of 15%  PRECAUTIONS: left UE Lymphedema risk, None  SUBJECTIVE: Pt returns for her 3 month L-Dex screen.  PAIN:  Are you having pain? No   SOZO SCREENING: Patient was assessed today using the SOZO machine to determine the lymphedema index score. This was compared to her baseline score. It was determined that she is within the recommended range when compared to her baseline and no further action is needed at this time. She will continue SOZO screenings. These are done every 3 months for 2 years post operatively followed by every 6 months for 2 years, and then annually. *Advised pt to apply cocoa butter and perform light scar tissue massage to port incision that is well healed (demo on pt). Also demo'd doorway stretch for her to add. Pt able to verbalize good understanding.    L-DEX FLOWSHEETS - 04/05/22 1500       L-DEX LYMPHEDEMA SCREENING   Measurement Type Unilateral    L-DEX MEASUREMENT  EXTREMITY Upper Extremity    POSITION  Standing    DOMINANT SIDE Right    At Risk Side Left    BASELINE SCORE (UNILATERAL) 1.8    L-DEX SCORE (UNILATERAL) -0.3    VALUE CHANGE (UNILAT) -2.1              Leah Chan, PTA 04/05/2022, 3:57 PM

## 2022-04-06 ENCOUNTER — Telehealth: Payer: Self-pay | Admitting: Hematology and Oncology

## 2022-04-06 NOTE — Telephone Encounter (Signed)
Rescheduled appointment per provider [facility]. Patient is aware of the changes made to her upcoming appointment. 

## 2022-04-21 ENCOUNTER — Inpatient Hospital Stay: Payer: BLUE CROSS/BLUE SHIELD | Admitting: Hematology and Oncology

## 2022-04-21 ENCOUNTER — Ambulatory Visit (AMBULATORY_SURGERY_CENTER): Payer: Medicaid Other | Admitting: *Deleted

## 2022-04-21 VITALS — Ht 63.0 in | Wt 138.0 lb

## 2022-04-21 DIAGNOSIS — Z1211 Encounter for screening for malignant neoplasm of colon: Secondary | ICD-10-CM

## 2022-04-21 MED ORDER — PEG 3350-KCL-NA BICARB-NACL 420 G PO SOLR: 4000.0000 mL | Freq: Once | ORAL | 0 refills | Status: AC

## 2022-04-21 NOTE — Progress Notes (Signed)

## 2022-04-27 NOTE — Progress Notes (Signed)
Patient Care Team: Herbert Deaner, FNP as PCP - General (Nurse Practitioner) [name], MD as Consulting Physician (General Surgery) [name], MD as Consulting Physician (Hematology and Oncology) Kyung Rudd, MD as Consulting Physician (Radiation Oncology)  DIAGNOSIS: No diagnosis found.  SUMMARY OF ONCOLOGIC HISTORY: Oncology History  Malignant neoplasm of upper-outer quadrant of left breast in female, estrogen receptor negative ([place])  01/20/2021 Initial Diagnosis   Screening mammogram detected 2 asymmetric areas with calcifications detected in March 2022.  October 2022 mammogram revealed left breast calcifications 1.8 cm at 1 o'clock position and a mass below the skin measuring 4 mm.  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 The report dates are January 29, 2021 and February 09, 2021.   The BRCAplus panel offered by [employer] and includes sequencing and deletion/duplication analysis for the following 8 genes: ATM, BRCA1, BRCA2, CDH1, CHEK2, PALB2, PTEN, and TP53.  The CustomNext-Cancer+RNAinsight panel offered by [employer] includes sequencing and rearrangement analysis for the following 52 genes:  APC, ATM, AXIN2, BAP1, BARD1, BMPR1A, BRCA1, BRCA2, BRIP1, CDH1, CDK4, CDKN2A, CHEK2CTNNA1, DICER1, MEN1, MLH1, MSH2, MSH3, MSH6, MUTYH, NBN, NF1, NTHL1, PALB2, PMS2, POT1, PTCH1, PTEN, RAD50, RAD51C, RAD51D, SDHA, SDHB, SDHC, SDHD, SMAD4, SMARCA4, STK11, SUFU, TP53, TSC1, TSC2 and VHL (sequencing and deletion/duplication); HOXB13, KIT, MITF, PDGFRA, POLD1 and POLE (sequencing only); EPCAM and GREM1 (deletion/duplication only). RNA data is routinely analyzed for use in variant interpretation for all genes.   02/09/2021  Surgery   Left Lumpectomy: Grade 3 IDC 0.6 cm with DCIS, Ant Margins pos for DCIS, LN Neg, Triple Negative   03/23/2021 - 05/25/2021 Chemotherapy   Patient is on Treatment Plan : BREAST TC q21d     06/30/2021 - 07/28/2021 Radiation Therapy   Site Technique Total Dose (Gy) Dose per Fx (Gy) Completed Fx Beam Energies  Breast, Left: Breast_L 3D 42.56/42.56 2.66 16/16 6XFFF  Breast, Left: Breast_L_Bst 3D 8/8 2 4/4 6X, 10X       CHIEF COMPLIANT: Follow-up breast cancer  INTERVAL HISTORY: Leah Chan is a 62 y.o. with above-mentioned history of malignant neoplasm of UOQ of the left breast currently on chemotherapy with TC. She presents to the clinic today for a follow-up.   ALLERGIES:  is allergic to latex and sulfa antibiotics.  MEDICATIONS:  Current Outpatient Medications  Medication Sig Dispense Refill   acetaminophen (TYLENOL) 500 MG tablet Take 500 mg by mouth as needed for mild pain. (Patient not taking: Reported on 04/21/2022)     calcium carbonate (TUMS - DOSED IN MG ELEMENTAL CALCIUM) 500 MG chewable tablet Chew 1 tablet by mouth daily as needed for indigestion or heartburn.     escitalopram (LEXAPRO) 10 MG tablet Take 10 mg by mouth daily.     GARLIC PO Take 1 capsule by mouth 4 (four) times a week.     hydrochlorothiazide (HYDRODIURIL) 25 MG tablet Take 25 mg by mouth daily.     Multiple Minerals (CALCIUM-MAGNESIUM-ZINC) TABS Take 1 tablet by mouth daily.     pravastatin (PRAVACHOL) 10 MG tablet Take 20 mg by mouth at bedtime.     traZODone (DESYREL) 50 MG tablet Take 50-100 mg by mouth at bedtime.     No current facility-administered medications for this visit.    PHYSICAL EXAMINATION: ECOG PERFORMANCE STATUS: {CHL  ONC ECOG [id number]  There were no vitals filed for this visit. There were no vitals filed for this visit.  BREAST:*** No palpable masses or nodules in either right or left breasts. No palpable axillary supraclavicular or infraclavicular adenopathy no  breast tenderness or nipple discharge. (exam performed in the presence of a chaperone)  LABORATORY DATA:  I have reviewed the data as listed    Latest Ref Rng & Units 05/25/2021   10:10 AM 05/04/2021   11:37 AM 04/13/2021   10:36 AM  CMP  Glucose 70 - 99 mg/dL 107  96  115   BUN 8 - 23 mg/dL 10  11  10   $ Creatinine 0.44 - 1.00 mg/dL 0.61  0.62  0.72   Sodium 135 - 145 mmol/L 139  139  138   Potassium 3.5 - 5.1 mmol/L 3.4  3.8  3.5   Chloride 98 - 111 mmol/L 104  104  102   CO2 22 - 32 mmol/L 30  28  28   $ Calcium 8.9 - 10.3 mg/dL 9.4  9.7  9.7   Total Protein 6.5 - 8.1 g/dL 5.9  6.7  6.7   Total Bilirubin 0.3 - 1.2 mg/dL 0.6  0.6  0.7   Alkaline Phos 38 - 126 U/L 64  65  65   AST 15 - 41 U/L 19  23  19   $ ALT 0 - 44 U/L 21  35  25     Lab Results  Component Value Date   WBC 6.5 05/25/2021   HGB 11.2 (L) 05/25/2021   HCT 32.3 (L) 05/25/2021   MCV 88.0 05/25/2021   PLT 260 05/25/2021   NEUTROABS 4.9 05/25/2021    ASSESSMENT & PLAN:  No problem-specific Assessment & Plan notes found for this encounter.    No orders of the defined types were placed in this encounter.  The patient has a good understanding of the overall plan. she agrees with it. she will call with any problems that may develop before the next visit here. Total time spent: 30 mins including face to face time and time spent for planning, charting and co-ordination of care   Suzzette Righter, [place] 04/27/22    I Gardiner Coins am acting as a Education administrator for [employer]  ***

## 2022-04-27 NOTE — Assessment & Plan Note (Signed)
Screening mammogram detected 2 asymmetric areas with calcifications detected in March 2022.  October 2022 mammogram revealed left breast calcifications 1.8 cm at 1 o'clock position and a mass below the skin measuring 4 mm.  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  Adjuvant chemo with Taxotere and Cytoxan every 3 weeks x4 completed 05/25/2021 2. adjuvant radiation 07/01/2021-07/27/2021 ------------------------------------------------------------------------------------------------------------------------- Current treatment: Surveillance Mammogram November 2023 [facility]: Benign breast density category A Breast exam 04/28/2022: Benign Patient recovered from all chemotherapy and radiation adverse effects.   Return to clinic in 1 year for follow-up

## 2022-04-28 ENCOUNTER — Inpatient Hospital Stay: Payer: BLUE CROSS/BLUE SHIELD | Attending: Hematology and Oncology | Admitting: Hematology and Oncology

## 2022-04-28 ENCOUNTER — Encounter: Payer: Self-pay | Admitting: *Deleted

## 2022-04-28 VITALS — BP 134/80 | HR 64 | Temp 97.8°F | Resp 14 | Ht 63.0 in | Wt 128.4 lb

## 2022-04-28 DIAGNOSIS — Z923 Personal history of irradiation: Secondary | ICD-10-CM | POA: Insufficient documentation

## 2022-04-28 DIAGNOSIS — Z171 Estrogen receptor negative status [ER-]: Secondary | ICD-10-CM

## 2022-04-28 DIAGNOSIS — C50412 Malignant neoplasm of upper-outer quadrant of left female breast: Secondary | ICD-10-CM | POA: Insufficient documentation

## 2022-04-28 DIAGNOSIS — Z79899 Other long term (current) drug therapy: Secondary | ICD-10-CM | POA: Diagnosis not present

## 2022-04-28 DIAGNOSIS — Z9221 Personal history of antineoplastic chemotherapy: Secondary | ICD-10-CM | POA: Diagnosis not present

## 2022-04-28 DIAGNOSIS — R5383 Other fatigue: Secondary | ICD-10-CM | POA: Insufficient documentation

## 2022-04-28 NOTE — Research (Signed)
RANDOMIZED [place] CONTROLLED TRIAL OF BUPROPION FOR CANCER RELATED FATIGUE   Patient Leah Chan was identified by Dr. Lindi Adie as a potential candidate for the above listed study.  This Clinical Research Nurse met with Leah Chan, [phone], on 04/28/22 in a manner and location that ensures patient privacy to discuss participation in the above listed research study.  Patient is Unaccompanied.  A copy of the informed consent document and separate HIPAA Authorization was provided to the patient.  Patient reads, speaks, and understands [place].   Patient was provided with the business card of this Nurse and encouraged to contact the research team with any questions.  Approximately 20 minutes was spent with the patient reviewing the informed consent documents.  Patient was provided the option of taking informed consent documents home to review and was encouraged to review at their convenience with their support network, including other care providers. Patient took the consent documents home to review.  This research nurse will call the pt next Monday or Tuesday in the morning to answer any questions about the study and discuss her participation.  The pt was thanked for her consideration of this study.  Brion Aliment RN, BSN, CCRP Clinical Research Nurse Lead 04/28/2022 11:51 AM

## 2022-04-30 ENCOUNTER — Telehealth: Payer: Self-pay | Admitting: Hematology and Oncology

## 2022-04-30 NOTE — Telephone Encounter (Signed)
Scheduled appointment per 2/14 los. Patient is aware of the made appointment.

## 2022-05-03 ENCOUNTER — Telehealth: Payer: Self-pay | Admitting: *Deleted

## 2022-05-03 NOTE — Telephone Encounter (Signed)
[facility] [id number]: RANDOMIZED [place] CONTROLLED TRIAL OF BUPROPION FOR CANCER RELATED FATIGUE   Research nurse called pt to discuss the above study. The pt was not available by phone.  Therefore, the nurse left a brief message asking the pt to return the nurse's call.    Brion Aliment RN, BSN, CCRP Clinical Research Nurse Lead 05/03/2022 12:06 PM

## 2022-05-12 ENCOUNTER — Encounter: Payer: Self-pay | Admitting: Gastroenterology

## 2022-05-12 ENCOUNTER — Ambulatory Visit: Payer: BLUE CROSS/BLUE SHIELD | Admitting: Gastroenterology

## 2022-05-12 VITALS — BP 126/54 | HR 60 | Temp 97.8°F | Resp 18

## 2022-05-12 DIAGNOSIS — Z1211 Encounter for screening for malignant neoplasm of colon: Secondary | ICD-10-CM

## 2022-05-12 MED ORDER — SODIUM CHLORIDE 0.9 % IV SOLN: 500.0000 mL | Freq: Once | INTRAVENOUS | Status: DC

## 2022-05-12 NOTE — Progress Notes (Signed)
A and O x3. Report to RN. Tolerated MAC anesthesia well. 

## 2022-05-12 NOTE — Progress Notes (Signed)
Pt's states no medical or surgical changes since previsit or office visit. 

## 2022-05-12 NOTE — Progress Notes (Signed)
[facility] History and Physical   Primary Care Physician:  Herbert Deaner, FNP   Reason for Procedure:   CRC screening  Plan:    colon     HPI: Leah Chan is a 62 y.o. female  Dad had prostate Ca , Not colon ca  Past Medical History:  Diagnosis Date   Allergy    SEASONAL   Anxiety    Asthma    Cancer ([place])    BREAST LEFT   Family history of prostate cancer 01/22/2021   Family history of skin cancer 01/22/2021   Genetic testing 02/02/2021   Negative hereditary cancer genetic testing: no pathogenic variants detected in [facility] BRCAPl[facility] Panel.  The report date is January 29, 2021.   The BRCAplus panel offered by [employer] and includes sequencing and deletion/duplication analysis for the following 8 genes: ATM, BRCA1, BRCA2, CDH1, CHEK2, PALB2, PTEN, and TP53.  Results of pan-cancer panel are pending.    Hyperlipidemia    Hypertension    Pneumonia 2010    Past Surgical History:  Procedure Laterality Date   BREAST LUMPECTOMY Right 2002   BREAST LUMPECTOMY WITH RADIOACTIVE SEED LOCALIZATION Left 02/09/2021   Procedure: LEFT BREAST LUMPECTOMY WITH RADIOACTIVE SEED LOCALIZATION (2 SEEDS);  Surgeon: [name], MD;  Location: [place];  Service: General;  Laterality: Left;   PORTACATH PLACEMENT Right 03/03/2021   Procedure: INSERTION PORT-A-CATH;  Surgeon: [name], MD;  Location: [place];  Service: General;  Laterality: Right;   REMOVED PORTACATH     SENTINEL NODE BIOPSY Left 02/09/2021   Procedure: LEFT AXILLARY SENTINEL LYMPH NODE BIOPSY;  Surgeon: [name], MD;  Location: [place];  Service: General;  Laterality: Left;    Prior to Admission medications   Medication Sig Start Date End Date Taking? Authorizing Provider  escitalopram (LEXAPRO) 10 MG tablet Take 10 mg by mouth daily. 10/24/20  Yes [provider]  GARLIC PO Take 1 capsule by mouth 4 (four) times a week.   Yes [provider]   hydrochlorothiazide (HYDRODIURIL) 25 MG tablet Take 25 mg by mouth daily. 01/07/21  Yes [provider]  Multiple Minerals (CALCIUM-MAGNESIUM-ZINC) TABS Take 1 tablet by mouth daily.   Yes [provider]  pravastatin (PRAVACHOL) 10 MG tablet Take 20 mg by mouth at bedtime. 10/24/20  Yes [provider]  zolpidem (AMBIEN CR) 6.25 MG CR tablet Take 6.25 mg by mouth at bedtime. 04/22/22  Yes [provider]  acetaminophen (TYLENOL) 500 MG tablet Take 500 mg by mouth as needed for mild pain. Patient not taking: Reported on 04/21/2022    [provider]  calcium carbonate (TUMS - DOSED IN MG ELEMENTAL CALCIUM) 500 MG chewable tablet Chew 1 tablet by mouth daily as needed for indigestion or heartburn.    [provider]    Current Outpatient Medications  Medication Sig Dispense Refill   escitalopram (LEXAPRO) 10 MG tablet Take 10 mg by mouth daily.     GARLIC PO Take 1 capsule by mouth 4 (four) times a week.     hydrochlorothiazide (HYDRODIURIL) 25 MG tablet Take 25 mg by mouth daily.     Multiple Minerals (CALCIUM-MAGNESIUM-ZINC) TABS Take 1 tablet by mouth daily.     pravastatin (PRAVACHOL) 10 MG tablet Take 20 mg by mouth at bedtime.     zolpidem (AMBIEN CR) 6.25 MG CR tablet Take 6.25 mg by mouth at bedtime.     acetaminophen (TYLENOL) 500 MG tablet Take 500 mg by  mouth as needed for mild pain. (Patient not taking: Reported on 04/21/2022)     calcium carbonate (TUMS - DOSED IN MG ELEMENTAL CALCIUM) 500 MG chewable tablet Chew 1 tablet by mouth daily as needed for indigestion or heartburn.     Current Facility-Administered Medications  Medication Dose Route Frequency Provider Last Rate Last Admin   0.9 %  sodium chloride infusion  500 mL Intravenous Once Jackquline Denmark, MD        Allergies as of 05/12/2022 - Review Complete 05/12/2022  Allergen Reaction Noted   Latex Itching 01/28/2021   Sulfa antibiotics Nausea And Vomiting 01/28/2021     Family History  Problem Relation Age of Onset   Prostate cancer Father        mets; d. 60   Prostate cancer Maternal Uncle        dx after 50   Esophageal cancer Paternal Aunt    Colon cancer Neg Hx    Colon polyps Neg Hx    Crohn's disease Neg Hx    Rectal cancer Neg Hx    Stomach cancer Neg Hx    Ulcerative colitis Neg Hx     Social History   Socioeconomic History   Marital status: Married    Spouse name: Not on file   Number of children: Not on file   Years of education: Not on file   Highest education level: Not on file  Occupational History   Not on file  Tobacco Use   Smoking status: Never   Smokeless tobacco: Never  Vaping Use   Vaping Use: Never used  Substance and Sexual Activity   Alcohol use: Yes    Comment: RARELY   Drug use: Never   Sexual activity: Not on file  Other Topics Concern   Not on file  Social History Narrative   Not on file   Social Determinants of Health   Financial Resource Strain: High Risk (06/02/2021)   Overall Financial Resource Strain (CARDIA)    Difficulty of Paying Living Expenses: Very hard  Food Insecurity: Not on file  Transportation Needs: Not on file  Physical Activity: Not on file  Stress: Not on file  Social Connections: Not on file  Intimate Partner Violence: Not on file    Review of Systems: Positive for none All other review of systems negative except as mentioned in the HPI.  Physical Exam: Vital signs in last 24 hours: '@VSRANGES'$ @   General:   Alert,  Well-developed, well-nourished, pleasant and cooperative in NAD Lungs:  Clear throughout to auscultation.   Heart:  Regular rate and rhythm; no murmurs, clicks, rubs,  or gallops. Abdomen:  Soft, nontender and nondistended. Normal bowel sounds.   Neuro/Psych:  Alert and cooperative. Normal mood and affect. A and O x 3    No significant changes were identified.  The patient continues to be an appropriate candidate for the planned procedure and  anesthesia.   Carmell Austria, MD. [facility] 05/12/2022 10:30 AM@

## 2022-05-12 NOTE — Patient Instructions (Addendum)
Handout on hemorrhoids given to you today   YOU HAD AN ENDOSCOPIC PROCEDURE TODAY AT [place]:   Refer to the procedure report that was given to you for any specific questions about what was found during the examination.  If the procedure report does not answer your questions, please call your gastroenterologist to clarify.  If you requested that your care partner not be given the details of your procedure findings, then the procedure report has been included in a sealed envelope for you to review at your convenience later.  YOU SHOULD EXPECT: Some feelings of bloating in the abdomen. Passage of more gas than usual.  Walking can help get rid of the air that was put into your GI tract during the procedure and reduce the bloating. If you had a lower endoscopy (such as a colonoscopy or flexible sigmoidoscopy) you may notice spotting of blood in your stool or on the toilet paper. If you underwent a bowel prep for your procedure, you may not have a normal bowel movement for a few days.  Please Note:  You might notice some irritation and congestion in your nose or some drainage.  This is from the oxygen used during your procedure.  There is no need for concern and it should clear up in a day or so.  SYMPTOMS TO REPORT IMMEDIATELY:  Following lower endoscopy (colonoscopy or flexible sigmoidoscopy):  Excessive amounts of blood in the stool  Significant tenderness or worsening of abdominal pains  Swelling of the abdomen that is new, acute  Fever of 100F or higher  For urgent or emergent issues, a gastroenterologist can be reached at any hour by calling [phone]. Do not use MyChart messaging for urgent concerns.    DIET:  We do recommend a small meal at first, but then you may proceed to your regular diet.  Drink plenty of fluids but you should avoid alcoholic beverages for 24 hours.  ACTIVITY:  You should plan to take it easy for the rest of today and you should NOT DRIVE or  use heavy machinery until tomorrow (because of the sedation medicines used during the test).    FOLLOW UP: Our staff will call the number listed on your records the next business day following your procedure.  We will call around 7:15- 8:00 am to check on you and address any questions or concerns that you may have regarding the information given to you following your procedure. If we do not reach you, we will leave a message.     If any biopsies were taken you will be contacted by phone or by letter within the next 1-3 weeks.  Please call us at [phone] if you have not heard about the biopsies in 3 weeks.    SIGNATURES/CONFIDENTIALITY: You and/or your care partner have signed paperwork which will be entered into your electronic medical record.  These signatures attest to the fact that that the information above on your After Visit Summary has been reviewed and is understood.  Full responsibility of the confidentiality of this discharge information lies with you and/or your care-partner.

## 2022-05-12 NOTE — Op Note (Signed)
[place] Patient Name: Leah Chan Procedure Date: 05/12/2022 10:25 AM MRN: [id number] Endoscopist: Jackquline Denmark , MD, [id number] Age: 62 Referring MD:  Date of Birth: 13-Mar-1961 Gender: Female Account #: [id number] Procedure:                Colonoscopy Indications:              Screening for colorectal malignant neoplasm. Prev                            reported FH CRC - dad is incorrect. He had history                            of prostate cancer. Medicines:                Monitored Anesthesia Care Procedure:                Pre-Anesthesia Assessment:                           - Prior to the procedure, a History and Physical                            was performed, and patient medications and                            allergies were reviewed. The patient's tolerance of                            previous anesthesia was also reviewed. The risks                            and benefits of the procedure and the sedation                            options and risks were discussed with the patient.                            All questions were answered, and informed consent                            was obtained. Prior Anticoagulants: The patient has                            taken no anticoagulant or antiplatelet agents. ASA                            Grade Assessment: II - A patient with mild systemic                            disease. After reviewing the risks and benefits,                            the patient was deemed in satisfactory condition to  undergo the procedure.                           After obtaining informed consent, the colonoscope                            was passed under direct vision. Throughout the                            procedure, the patient's blood pressure, pulse, and                            oxygen saturations were monitored continuously. The                            Olympus [id number]-[id number]  Colonoscope was                            introduced through the anus and advanced to the 2                            cm into the ileum. The colonoscopy was performed                            without difficulty. The patient tolerated the                            procedure well. The quality of the bowel                            preparation was good. The terminal ileum, ileocecal                            valve, appendiceal orifice, and rectum were                            photographed. Scope In: 10:42:08 AM Scope Out: 10:53:49 AM Scope Withdrawal Time: 0 hours 7 minutes 25 seconds  Total Procedure Duration: 0 hours 11 minutes 41 seconds  Findings:                 The colon (entire examined portion) appeared normal.                           Non-bleeding internal hemorrhoids were found during                            retroflexion. The hemorrhoids were small and Grade                            I (internal hemorrhoids that do not prolapse).                           The terminal ileum appeared normal.  The exam was otherwise without abnormality on                            direct and retroflexion views. Complications:            No immediate complications. Estimated Blood Loss:     Estimated blood loss: none. Impression:               - Small internal hemorrhoids.                           - Otherwise normal colonoscopy to TI.                           - No specimens collected. Recommendation:           - Patient has a contact number available for                            emergencies. The signs and symptoms of potential                            delayed complications were discussed with the                            patient. Return to normal activities tomorrow.                            Written discharge instructions were provided to the                            patient.                           - Resume previous diet.                            - Continue present medications.                           - Repeat colonoscopy in 10 years for screening                            purposes. Earlier, if with any new problems or                            change in family history.                           - The findings and recommendations were discussed                            with the patient's family. Jackquline Denmark, MD 05/12/2022 10:59:48 AM This report has been signed electronically.

## 2022-05-13 ENCOUNTER — Telehealth: Payer: Self-pay

## 2022-05-13 NOTE — Telephone Encounter (Signed)
Post procedure follow up call, no answer. 

## 2022-05-17 ENCOUNTER — Telehealth: Payer: Self-pay | Admitting: *Deleted

## 2022-05-17 NOTE — Telephone Encounter (Signed)
[facility] 18007: RANDOMIZED [place] CONTROLLED TRIAL OF BUPROPION FOR CANCER RELATED FATIGUE   The research nurse called this pt to discuss the above study.  The pt said that she has not read through the consent form yet.  The pt said that she would read over the study documents and then let the nurse know her decision regarding participation.  The pt was given the nurse's name and contact information. The pt was encouraged to call the nurse if she has any question/concerns about the study.  The pt was thanked for her continued interest.    Brion Aliment RN, BSN, CCRP Clinical Research Nurse Lead 05/17/2022 4:32 PM

## 2022-06-01 ENCOUNTER — Telehealth: Payer: Self-pay | Admitting: *Deleted

## 2022-06-01 NOTE — Telephone Encounter (Signed)
[facility] 18007: RANDOMIZED [place] CONTROLLED TRIAL OF BUPROPION FOR CANCER RELATED FATIGUE   The research nurse last spoke to this pt on 05/17/22 about the study.  The nurse called the pt today to see if she had read over the consent form and to answer any questions about the study.  The pt was not available by phone.  The nurse left a brief message asking the pt to return the nurse's call.  Will await pt's return call.  Brion Aliment RN, BSN, CCRP Clinical Research Nurse Lead 06/01/2022 4:41 PM

## 2022-07-05 ENCOUNTER — Ambulatory Visit: Payer: BLUE CROSS/BLUE SHIELD | Attending: Surgery

## 2022-07-05 VITALS — Wt 128.2 lb

## 2022-07-05 DIAGNOSIS — Z483 Aftercare following surgery for neoplasm: Secondary | ICD-10-CM | POA: Insufficient documentation

## 2022-07-05 NOTE — Therapy (Signed)
OUTPATIENT PHYSICAL THERAPY SOZO SCREENING NOTE   Patient Name: Versa Craton MRN: [id number] DOB:03/18/1960, 62 y.o., female Today's Date: 07/05/2022  PCP: Ellis Parents, FNP REFERRING PROVIDER: [name], MD   PT End of Session - 07/05/22 1549     Visit Number 2   # unchanged due to screen only   PT Start Time 1546    PT Stop Time 1551    PT Time Calculation (min) 5 min    Activity Tolerance Patient tolerated treatment well    Behavior During Therapy [facility] for tasks assessed/performed             Past Medical History:  Diagnosis Date   Allergy    SEASONAL   Anxiety    Asthma    Cancer (HCC)    BREAST LEFT   Family history of prostate cancer 01/22/2021   Family history of skin cancer 01/22/2021   Genetic testing 02/02/2021   Negative hereditary cancer genetic testing: no pathogenic variants detected in [facility] Panel.  The report date is January 29, 2021.   The BRCAplus panel offered by [employer] and includes sequencing and deletion/duplication analysis for the following 8 genes: ATM, BRCA1, BRCA2, CDH1, CHEK2, PALB2, PTEN, and TP53.  Results of pan-cancer panel are pending.    Hyperlipidemia    Hypertension    Pneumonia 2010   Past Surgical History:  Procedure Laterality Date   BREAST LUMPECTOMY Right 2002   BREAST LUMPECTOMY WITH RADIOACTIVE SEED LOCALIZATION Left 02/09/2021   Procedure: LEFT BREAST LUMPECTOMY WITH RADIOACTIVE SEED LOCALIZATION (2 SEEDS);  Surgeon: [name], MD;  Location: [facility] OR;  Service: General;  Laterality: Left;   PORTACATH PLACEMENT Right 03/03/2021   Procedure: INSERTION PORT-A-CATH;  Surgeon: [name], MD;  Location: [facility];  Service: General;  Laterality: Right;   REMOVED PORTACATH     SENTINEL NODE BIOPSY Left 02/09/2021   Procedure: LEFT AXILLARY SENTINEL LYMPH NODE BIOPSY;  Surgeon: [name], MD;  Location: [facility] OR;  Service: General;  Laterality: Left;   Patient  Active Problem List   Diagnosis Date Noted   Genetic testing 02/02/2021   Family history of prostate cancer 01/22/2021   Family history of skin cancer 01/22/2021   Malignant neoplasm of upper-outer quadrant of left breast in female, estrogen receptor negative 01/20/2021    REFERRING DIAG: left breast cancer at risk for lymphedema  THERAPY DIAG: Aftercare following surgery for neoplasm  PERTINENT HISTORY: Patient was diagnosed on 12/25/2020 with left grade III invasive ductal carcinoma breast cancer. She underwent a left lumpectomy and sentinel node biopsy (2 negative nodes) on 02/09/2021. It is triple negative with a Ki67 of 15%  PRECAUTIONS: left UE Lymphedema risk, None  SUBJECTIVE: Pt returns for her 3 month L-Dex screen.  PAIN:  Are you having pain? No   SOZO SCREENING: Patient was assessed today using the SOZO machine to determine the lymphedema index score. This was compared to her baseline score. It was determined that she is within the recommended range when compared to her baseline and no further action is needed at this time. She will continue SOZO screenings. These are done every 3 months for 2 years post operatively followed by every 6 months for 2 years, and then annually. *Advised pt to apply cocoa butter and perform light scar tissue massage to port incision that is well healed (demo on pt). Also demo'd doorway stretch for her to add. Pt able to verbalize good understanding.    L-DEX FLOWSHEETS -  07/05/22 1500       L-DEX LYMPHEDEMA SCREENING   Measurement Type Unilateral    L-DEX MEASUREMENT EXTREMITY Upper Extremity    POSITION  Standing    DOMINANT SIDE Right    At Risk Side Left    BASELINE SCORE (UNILATERAL) 1.8    L-DEX SCORE (UNILATERAL) -0.6    VALUE CHANGE (UNILAT) -2.4              Hermenia Bers, PTA 07/05/2022, 3:51 PM

## 2022-10-04 ENCOUNTER — Ambulatory Visit: Payer: Medicaid Other | Attending: Surgery | Admitting: Rehabilitation

## 2022-10-04 ENCOUNTER — Encounter: Payer: Self-pay | Admitting: Rehabilitation

## 2022-10-04 DIAGNOSIS — Z483 Aftercare following surgery for neoplasm: Secondary | ICD-10-CM | POA: Insufficient documentation

## 2022-10-04 DIAGNOSIS — Z170 Estrogen receptor positive status [ER+]: Secondary | ICD-10-CM | POA: Insufficient documentation

## 2022-10-04 DIAGNOSIS — C50412 Malignant neoplasm of upper-outer quadrant of left female breast: Secondary | ICD-10-CM | POA: Insufficient documentation

## 2022-10-04 NOTE — Therapy (Signed)
OUTPATIENT PHYSICAL THERAPY SOZO SCREENING NOTE   Patient Name: Leah Chan MRN: [id number] DOB:[birth year], 62 y.o., female Today's Date: 10/04/2022  PCP: Ellis Parents, FNP REFERRING PROVIDER: [name], MD   PT End of Session - 10/04/22 1535     Visit Number 2   screen   PT Start Time 1532    PT Stop Time 1535    PT Time Calculation (min) 3 min    Activity Tolerance Patient tolerated treatment well    Behavior During Therapy [facility] for tasks assessed/performed             Past Medical History:  Diagnosis Date   Allergy    SEASONAL   Anxiety    Asthma    Cancer (HCC)    BREAST LEFT   Family history of prostate cancer 01/22/2021   Family history of skin cancer 01/22/2021   Genetic testing 02/02/2021   Negative hereditary cancer genetic testing: no pathogenic variants detected in [facility] Panel.  The report date is January 29, 2021.   The BRCAplus panel offered by [employer] and includes sequencing and deletion/duplication analysis for the following 8 genes: ATM, BRCA1, BRCA2, CDH1, CHEK2, PALB2, PTEN, and TP53.  Results of pan-cancer panel are pending.    Hyperlipidemia    Hypertension    Pneumonia 2010   Past Surgical History:  Procedure Laterality Date   BREAST LUMPECTOMY Right 2002   BREAST LUMPECTOMY WITH RADIOACTIVE SEED LOCALIZATION Left 02/09/2021   Procedure: LEFT BREAST LUMPECTOMY WITH RADIOACTIVE SEED LOCALIZATION (2 SEEDS);  Surgeon: [name], MD;  Location[facility] OR;  Service: General;  Laterality: Left;   PORTACATH PLACEMENT Right 03/03/2021   Procedure: INSERTION PORT-A-CATH;  Surgeon: [name], MD;  Location: [facility];  Service: General;  Laterality: Right;   REMOVED PORTACATH     SENTINEL NODE BIOPSY Left 02/09/2021   Procedure: LEFT AXILLARY SENTINEL LYMPH NODE BIOPSY;  Surgeon: [name], MD;  Location: [facility] OR;  Service: General;  Laterality: Left;   Patient Active Problem List    Diagnosis Date Noted   Genetic testing 02/02/2021   Family history of prostate cancer 01/22/2021   Family history of skin cancer 01/22/2021   Malignant neoplasm of upper-outer quadrant of left breast in female, estrogen receptor negative (HCC) 01/20/2021    REFERRING DIAG: left breast cancer at risk for lymphedema  THERAPY DIAG: Aftercare following surgery for neoplasm  Malignant neoplasm of upper-outer quadrant of left breast in female, estrogen receptor positive (HCC)  PERTINENT HISTORY: Patient was diagnosed on 12/25/2020 with left grade III invasive ductal carcinoma breast cancer. She underwent a left lumpectomy and sentinel node biopsy (2 negative nodes) on 02/09/2021. It is triple negative with a Ki67 of 15%  PRECAUTIONS: left UE Lymphedema risk, None  SUBJECTIVE: Pt returns for her 3 month L-Dex screen.  PAIN:  Are you having pain? No   SOZO SCREENING: Patient was assessed today using the SOZO machine to determine the lymphedema index score. This was compared to her baseline score. It was determined that she is within the recommended range when compared to her baseline and no further action is needed at this time. She will continue SOZO screenings. These are done every 3 months for 2 years post operatively followed by every 6 months for 2 years, and then annually. *Advised pt to apply cocoa butter and perform light scar tissue massage to port incision that is well healed (demo on pt). Also demo'd doorway stretch for her to add.  Pt able to verbalize good understanding.    L-DEX FLOWSHEETS - 10/04/22 1500       L-DEX LYMPHEDEMA SCREENING   Measurement Type Unilateral    L-DEX MEASUREMENT EXTREMITY Upper Extremity    POSITION  Standing    DOMINANT SIDE Right    At Risk Side Left    BASELINE SCORE (UNILATERAL) 1.8    L-DEX SCORE (UNILATERAL) 1.9    VALUE CHANGE (UNILAT) 0.1              Alajia Schmelzer, Julieanne Manson, PT 10/04/2022, 3:36 PM

## 2023-01-10 ENCOUNTER — Ambulatory Visit: Payer: Medicaid Other | Attending: Surgery

## 2023-01-10 VITALS — Wt 122.4 lb

## 2023-01-10 DIAGNOSIS — Z483 Aftercare following surgery for neoplasm: Secondary | ICD-10-CM | POA: Insufficient documentation

## 2023-01-10 NOTE — Therapy (Signed)
OUTPATIENT PHYSICAL THERAPY SOZO SCREENING NOTE   Patient Name: Leah Chan MRN: [id number] DOB:12/29/1960, 62 y.o., female Today's Date: 01/10/2023  PCP: Ellis Parents, FNP REFERRING PROVIDER: [name], MD   PT End of Session - 01/10/23 [phone]     Visit Number 2   # unchanged due to screen only   PT Start Time 0935    PT Stop Time 0940    PT Time Calculation (min) 5 min    Activity Tolerance Patient tolerated treatment well    Behavior During Therapy [facility] for tasks assessed/performed             Past Medical History:  Diagnosis Date   Allergy    SEASONAL   Anxiety    Asthma    Cancer (HCC)    BREAST LEFT   Family history of prostate cancer 01/22/2021   Family history of skin cancer 01/22/2021   Genetic testing 02/02/2021   Negative hereditary cancer genetic testing: no pathogenic variants detected in [facility] Panel.  The report date is January 29, 2021.   The BRCAplus panel offered by [employer] and includes sequencing and deletion/duplication analysis for the following 8 genes: ATM, BRCA1, BRCA2, CDH1, CHEK2, PALB2, PTEN, and TP53.  Results of pan-cancer panel are pending.    Hyperlipidemia    Hypertension    Pneumonia 2010   Past Surgical History:  Procedure Laterality Date   BREAST LUMPECTOMY Right 2002   BREAST LUMPECTOMY WITH RADIOACTIVE SEED LOCALIZATION Left 02/09/2021   Procedure: LEFT BREAST LUMPECTOMY WITH RADIOACTIVE SEED LOCALIZATION (2 SEEDS);  Surgeon: [name], MD;  Location: [facility] OR;  Service: General;  Laterality: Left;   PORTACATH PLACEMENT Right 03/03/2021   Procedure: INSERTION PORT-A-CATH;  Surgeon: [name], MD;  Location: [facility];  Service: General;  Laterality: Right;   REMOVED PORTACATH     SENTINEL NODE BIOPSY Left 02/09/2021   Procedure: LEFT AXILLARY SENTINEL LYMPH NODE BIOPSY;  Surgeon: [name], MD;  Location: [facility] OR;  Service: General;  Laterality: Left;   Patient  Active Problem List   Diagnosis Date Noted   Genetic testing 02/02/2021   Family history of prostate cancer 01/22/2021   Family history of skin cancer 01/22/2021   Malignant neoplasm of upper-outer quadrant of left breast in female, estrogen receptor negative (HCC) 01/20/2021    REFERRING DIAG: left breast cancer at risk for lymphedema  THERAPY DIAG: Aftercare following surgery for neoplasm  PERTINENT HISTORY: Patient was diagnosed on 12/25/2020 with left grade III invasive ductal carcinoma breast cancer. She underwent a left lumpectomy and sentinel node biopsy (2 negative nodes) on 02/09/2021. It is triple negative with a Ki67 of 15%  PRECAUTIONS: left UE Lymphedema risk, None  SUBJECTIVE: Pt returns for her last 3 month L-Dex screen.  PAIN:  Are you having pain? No   SOZO SCREENING: Patient was assessed today using the SOZO machine to determine the lymphedema index score. This was compared to her baseline score. It was determined that she is within the recommended range when compared to her baseline and no further action is needed at this time. She will continue SOZO screenings. These are done every 3 months for 2 years post operatively followed by every 6 months for 2 years, and then annually.    L-DEX FLOWSHEETS - 01/10/23 0900       L-DEX LYMPHEDEMA SCREENING   Measurement Type Unilateral    L-DEX MEASUREMENT EXTREMITY Upper Extremity    POSITION  Standing  DOMINANT SIDE Right    At Risk Side Left    BASELINE SCORE (UNILATERAL) 1.8    L-DEX SCORE (UNILATERAL) 0.3    VALUE CHANGE (UNILAT) -1.5            P: Begin 6 month L-Dex screens next.   Hermenia Bers, PTA 01/10/2023, 9:41 AM

## 2023-02-21 ENCOUNTER — Encounter: Payer: Self-pay | Admitting: Adult Health

## 2023-02-22 ENCOUNTER — Encounter (HOSPITAL_BASED_OUTPATIENT_CLINIC_OR_DEPARTMENT_OTHER): Payer: Self-pay

## 2023-02-22 DIAGNOSIS — G4710 Hypersomnia, unspecified: Secondary | ICD-10-CM

## 2023-02-22 DIAGNOSIS — R5383 Other fatigue: Secondary | ICD-10-CM

## 2023-03-14 ENCOUNTER — Telehealth: Payer: Self-pay | Admitting: Hematology and Oncology

## 2023-03-14 NOTE — Telephone Encounter (Signed)
Phone number is unavailable and unable to leave a message with patient in regards to rescheduled appointment times/dates

## 2023-04-26 ENCOUNTER — Encounter (HOSPITAL_BASED_OUTPATIENT_CLINIC_OR_DEPARTMENT_OTHER): Payer: Medicaid Other | Admitting: Internal Medicine

## 2023-05-02 ENCOUNTER — Ambulatory Visit: Payer: Medicaid Other | Admitting: Hematology and Oncology

## 2023-05-02 NOTE — Assessment & Plan Note (Signed)
Screening mammogram detected 2 asymmetric areas with calcifications detected in March 2022.  October 2022 mammogram revealed left breast calcifications 1.8 cm at 1 o'clock position and a mass below the skin measuring 4 mm.  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  Adjuvant chemo with Taxotere and Cytoxan every 3 weeks x4 completed 05/25/2021 2. adjuvant radiation 07/01/2021-07/27/2021 ------------------------------------------------------------------------------------------------------------------------- Current treatment: Surveillance Mammogram 02/16/23 at [facility]: Benign breast density category A Breast exam 05/01/22: Benign   Fatigue related to prior treatments.   Return to clinic in 1 year for follow-up

## 2023-05-03 ENCOUNTER — Inpatient Hospital Stay: Payer: Medicaid Other | Attending: Hematology and Oncology | Admitting: Hematology and Oncology

## 2023-05-03 VITALS — BP 141/63 | HR 63 | Temp 98.0°F | Resp 18 | Ht 63.0 in | Wt 126.3 lb

## 2023-05-03 DIAGNOSIS — C50412 Malignant neoplasm of upper-outer quadrant of left female breast: Secondary | ICD-10-CM | POA: Insufficient documentation

## 2023-05-03 DIAGNOSIS — Z171 Estrogen receptor negative status [ER-]: Secondary | ICD-10-CM | POA: Insufficient documentation

## 2023-05-03 DIAGNOSIS — R5383 Other fatigue: Secondary | ICD-10-CM | POA: Insufficient documentation

## 2023-05-03 DIAGNOSIS — Z923 Personal history of irradiation: Secondary | ICD-10-CM | POA: Diagnosis not present

## 2023-05-03 DIAGNOSIS — Z79899 Other long term (current) drug therapy: Secondary | ICD-10-CM | POA: Insufficient documentation

## 2023-05-03 DIAGNOSIS — Z9221 Personal history of antineoplastic chemotherapy: Secondary | ICD-10-CM | POA: Insufficient documentation

## 2023-05-03 DIAGNOSIS — R413 Other amnesia: Secondary | ICD-10-CM | POA: Diagnosis not present

## 2023-05-03 NOTE — Research (Signed)
[id number]: COGNITIVE TRAINING FOR CANCER RELATED COGNITIVE IMPAIRMENT IN BREAST CANCER SURVIVORS: A MULTI-CENTER RANDOMIZED DOUBLE- BLINDED CONTROLLED TRIAL   Patient Leah Chan was identified by Dr. Pamelia Hoit as a potential candidate for the above listed study.  This Clinical Research Nurse met with Leah Chan, [id number], on 05/03/23 in a manner and location that ensures patient privacy to discuss participation in the above listed research study.  Patient is Unaccompanied.  A copy of the informed consent document and separate HIPAA Authorization was provided to the patient.  Patient reads, speaks, and understands [place].   Patient was provided with the business card of this Nurse and encouraged to contact the research team with any questions.  Approximately 20 minutes were spent with the patient reviewing the informed consent documents.  Patient was provided the option of taking informed consent documents home to review and was encouraged to review at their convenience with their support network, including other care providers. Patient took the consent documents home to review.    Pt did state she was concerned about her memory and did want to participate in the study. Pt was prescreened by research Rn and did not pass pre screening. Rn made Dr. Pamelia Hoit aware of this screen fail. Pt did report that she is going through some family health issues at this time and would like further follow up and to be pre screened again in two weeks. Research RN will call pt on March 3rd at 11am to follow up pt.  Pt knows to reach out with any questions or concerns.   Zerita Boers BSN RN Clinical Research Nurse [facility] Direct Dial: [phone] 05/03/2023  1:13 PM

## 2023-05-03 NOTE — Progress Notes (Signed)
Patient Care Team: Ellis Parents, FNP as PCP - General (Nurse Practitioner) [name], MD as Consulting Physician (General Surgery) Serena Croissant, MD as Consulting Physician (Hematology and Oncology) Dorothy Puffer, MD as Consulting Physician (Radiation Oncology)  DIAGNOSIS:  Encounter Diagnosis  Name Primary?   Malignant neoplasm of upper-outer quadrant of left breast in female, estrogen receptor negative (HCC) Yes    SUMMARY OF ONCOLOGIC HISTORY: Oncology History  Malignant neoplasm of upper-outer quadrant of left breast in female, estrogen receptor negative (HCC)  01/20/2021 Initial Diagnosis   Screening mammogram detected 2 asymmetric areas with calcifications detected in March 2022.  October 2022 mammogram revealed left breast calcifications 1.8 cm at 1 o'clock position and a mass below the skin measuring 4 mm.  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 The report dates are January 29, 2021 and February 09, 2021.   The BRCAplus panel offered by [employer] and includes sequencing and deletion/duplication analysis for the following 8 genes: ATM, BRCA1, BRCA2, CDH1, CHEK2, PALB2, PTEN, and TP53.  The CustomNext-Cancer+RNAinsight panel offered by [employer] includes sequencing and rearrangement analysis for the following 52 genes:  APC, ATM, AXIN2, BAP1, BARD1, BMPR1A, BRCA1, BRCA2, BRIP1, CDH1, CDK4, CDKN2A, CHEK2CTNNA1, DICER1, MEN1, MLH1, MSH2, MSH3, MSH6, MUTYH, NBN, NF1, NTHL1, PALB2, PMS2, POT1, PTCH1, PTEN, RAD50, RAD51C, RAD51D, SDHA, SDHB, SDHC, SDHD, SMAD4, SMARCA4, STK11, SUFU, TP53, TSC1, TSC2 and VHL (sequencing and deletion/duplication); HOXB13, KIT, MITF, PDGFRA, POLD1 and POLE (sequencing only); EPCAM and  GREM1 (deletion/duplication only). RNA data is routinely analyzed for use in variant interpretation for all genes.   02/09/2021 Surgery   Left Lumpectomy: Grade 3 IDC 0.6 cm with DCIS, Ant Margins pos for DCIS, LN Neg, Triple Negative   03/23/2021 - 05/25/2021 Chemotherapy   Patient is on Treatment Plan : BREAST TC q21d     06/30/2021 - 07/28/2021 Radiation Therapy   Site Technique Total Dose (Gy) Dose per Fx (Gy) Completed Fx Beam Energies  Breast, Left: Breast_L 3D 42.56/42.56 2.66 16/16 6XFFF  Breast, Left: Breast_L_Bst 3D 8/8 2 4/4 6X, 10X       CHIEF COMPLIANT: Surveillance of breast cancer  HISTORY OF PRESENT ILLNESS:   History of Present Illness   Leah Chan is a 63 year old female with breast cancer who presents for follow-up regarding post-treatment symptoms.  She was diagnosed with breast cancer in 2022 and has undergone chemotherapy. She is now three years post-diagnosis and is experiencing some lingering side effects from her treatment.  She experiences occasional chest tenderness, describing it as a sensation similar to 'if you bump your hand.' This tenderness occurs sporadically, particularly when she loses items or exerts herself. It is not a sharp pain but rather a discomfort that can be alleviated by adjusting her position. No significant pain in the chest area, more tenderness.  She experiences memory issues, such as forgetting what she intends to say mid-sentence. Her family has commented on her memory lapses, suggesting she might be developing dementia. She attributes some of her memory problems to poor sleep, as she does not sleep well at all.         ALLERGIES:  is allergic to latex and sulfa antibiotics.  MEDICATIONS:  Current Outpatient Medications  Medication Sig Dispense Refill   acetaminophen (TYLENOL) 500 MG tablet  Take 500 mg by mouth as needed for mild pain. (Patient not taking: Reported on 04/21/2022)     calcium carbonate (TUMS - DOSED IN MG  ELEMENTAL CALCIUM) 500 MG chewable tablet Chew 1 tablet by mouth daily as needed for indigestion or heartburn.     escitalopram (LEXAPRO) 10 MG tablet Take 10 mg by mouth daily.     GARLIC PO Take 1 capsule by mouth 4 (four) times a week.     hydrochlorothiazide (HYDRODIURIL) 25 MG tablet Take 25 mg by mouth daily.     Multiple Minerals (CALCIUM-MAGNESIUM-ZINC) TABS Take 1 tablet by mouth daily.     pravastatin (PRAVACHOL) 10 MG tablet Take 20 mg by mouth at bedtime.     zolpidem (AMBIEN CR) 6.25 MG CR tablet Take 6.25 mg by mouth at bedtime.     No current facility-administered medications for this visit.    PHYSICAL EXAMINATION: ECOG PERFORMANCE STATUS: 1 - Symptomatic but completely ambulatory  Vitals:   05/03/23 0943  BP: (!) 141/63  Pulse: 63  Resp: 18  Temp: 98 F (36.7 C)  SpO2: 99%   Filed Weights   05/03/23 0943  Weight: 126 lb 4.8 oz (57.3 kg)    Physical Exam   BREAST: Breasts normal      (exam performed in the presence of a chaperone)  LABORATORY DATA:  I have reviewed the data as listed    Latest Ref Rng & Units 05/25/2021   10:10 AM 05/04/2021   11:37 AM 04/13/2021   10:36 AM  CMP  Glucose 70 - 99 mg/dL 782  96  956   BUN 8 - 23 mg/dL 10  11  10    Creatinine 0.44 - 1.00 mg/dL 2.13  0.86  5.78   Sodium 135 - 145 mmol/L 139  139  138   Potassium 3.5 - 5.1 mmol/L 3.4  3.8  3.5   Chloride 98 - 111 mmol/L 104  104  102   CO2 22 - 32 mmol/L 30  28  28    Calcium 8.9 - 10.3 mg/dL 9.4  9.7  9.7   Total Protein 6.5 - 8.1 g/dL 5.9  6.7  6.7   Total Bilirubin 0.3 - 1.2 mg/dL 0.6  0.6  0.7   Alkaline Phos 38 - 126 U/L 64  65  65   AST 15 - 41 U/L 19  23  19    ALT 0 - 44 U/L 21  35  25     Lab Results  Component Value Date   WBC 6.5 05/25/2021   HGB 11.2 (L) 05/25/2021   HCT 32.3 (L) 05/25/2021   MCV 88.0 05/25/2021   PLT 260 05/25/2021   NEUTROABS 4.9 05/25/2021    ASSESSMENT & PLAN:  Malignant neoplasm of upper-outer quadrant of left breast in female,  estrogen receptor negative (HCC) Screening mammogram detected 2 asymmetric areas with calcifications detected in March 2022.  October 2022 mammogram revealed left breast calcifications 1.8 cm at 1 o'clock position and a mass below the skin measuring 4 mm.  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  Adjuvant chemo with Taxotere and Cytoxan every 3 weeks x4 completed 05/25/2021 2. adjuvant radiation 07/01/2021-07/27/2021 ------------------------------------------------------------------------------------------------------------------------- Current treatment: Surveillance Mammogram 02/16/23 at [facility]: Benign breast density category A Breast exam  05/01/22: Benign   Fatigue related to prior treatments. Memory loss: I recommended participation in the memory loss clinical trial Return to clinic in 1 year for follow-up     No orders of the defined types were placed in this encounter.  The patient has a good understanding of the overall plan. she agrees with it. she will call with any problems that may develop before the next visit here. Total time spent: 30 mins including face to face time and time spent for planning, charting and co-ordination of care   Tamsen Meek, MD 05/03/23

## 2023-05-16 ENCOUNTER — Telehealth: Payer: Self-pay

## 2023-05-16 NOTE — Telephone Encounter (Signed)
 [id number]: COGNITIVE TRAINING FOR CANCER RELATED COGNITIVE IMPAIRMENT IN BREAST CANCER SURVIVORS: A MULTI-CENTER RANDOMIZED DOUBLE- BLINDED CONTROLLED TRIAL   Rn attempted to call pt to follow up on pt potential participation in this study but was unable to reach her. Pt also had voicemail that was full and Rn was unable to leave message. RN will call pt later this week if she has not called back RN.   Zerita Boers BSN RN Clinical Research Nurse [facility] Direct Dial: [phone] 05/16/2023  11:11 AM

## 2023-05-19 ENCOUNTER — Telehealth: Payer: Self-pay

## 2023-05-19 NOTE — Telephone Encounter (Signed)
 [id number]: COGNITIVE TRAINING FOR CANCER RELATED COGNITIVE IMPAIRMENT IN BREAST CANCER SURVIVORS: A MULTI-CENTER RANDOMIZED DOUBLE- BLINDED CONTROLLED TRIAL   RN called pt in an attempt for follow up on this study. Pt did not answer the phone and pt voicemail was full. RN will call pt back later this week in an attempt to reach pt.   Zerita Boers BSN RN Clinical Research Nurse [facility] Direct Dial: [phone] 05/19/2023  9:14 AM

## 2023-05-20 ENCOUNTER — Telehealth: Payer: Self-pay

## 2023-05-20 NOTE — Telephone Encounter (Signed)
 [id number]: COGNITIVE TRAINING FOR CANCER RELATED COGNITIVE IMPAIRMENT IN BREAST CANCER SURVIVORS: A MULTI-CENTER RANDOMIZED DOUBLE- BLINDED CONTROLLED TRIAL   Research RN called pt in an attempt to follow up with her about her participation in this study. Pt voicemail was full and RN was unable to leave message for pt. RN will call back pt next week in an attempt to reach her.   Zerita Boers BSN RN Clinical Research Nurse [facility] Direct Dial: [phone] 05/20/2023  9:24 AM

## 2023-05-23 ENCOUNTER — Telehealth: Payer: Self-pay

## 2023-05-23 NOTE — Telephone Encounter (Signed)
 [id number]: COGNITIVE TRAINING FOR CANCER RELATED COGNITIVE IMPAIRMENT IN BREAST CANCER SURVIVORS: A MULTI-CENTER RANDOMIZED DOUBLE- BLINDED CONTROLLED TRIAL   Research RN called pt again today without success. RN was calling to follow up with pt on participation in this study. Rn got no answer and pt voicemail was full (unable to leave message). RN will no longer call pt about this study and screen fail the pt. Pt does have research RN contact number if she would like to participate.   Zerita Boers BSN RN Clinical Research Nurse [facility] Direct Dial: [phone] 05/23/2023  10:20 AM

## 2023-06-27 ENCOUNTER — Ambulatory Visit: Payer: Medicaid Other | Attending: Surgery

## 2023-06-27 VITALS — Wt 127.5 lb

## 2023-06-27 DIAGNOSIS — Z483 Aftercare following surgery for neoplasm: Secondary | ICD-10-CM | POA: Insufficient documentation

## 2023-06-27 NOTE — Therapy (Signed)
 OUTPATIENT PHYSICAL THERAPY SOZO SCREENING NOTE   Patient Name: Leah Chan MRN: [id number] DOB:[birth year], 63 y.o., female Today's Date: 06/27/2023  PCP: Ellis Parents, FNP REFERRING PROVIDER: [name], MD   PT End of Session - 06/27/23 [phone]     Visit Number 2   # unchanged due to screen only   PT Start Time 0949    PT Stop Time 0953    PT Time Calculation (min) 4 min    Activity Tolerance Patient tolerated treatment well    Behavior During Therapy [facility] for tasks assessed/performed             Past Medical History:  Diagnosis Date   Allergy    SEASONAL   Anxiety    Asthma    Cancer (HCC)    BREAST LEFT   Family history of prostate cancer 01/22/2021   Family history of skin cancer 01/22/2021   Genetic testing 02/02/2021   Negative hereditary cancer genetic testing: no pathogenic variants detected in [facility] BRCAPlus Panel.  The report date is January 29, 2021.   The BRCAplus panel offered by [employer] and includes sequencing and deletion/duplication analysis for the following 8 genes: ATM, BRCA1, BRCA2, CDH1, CHEK2, PALB2, PTEN, and TP53.  Results of pan-cancer panel are pending.    Hyperlipidemia    Hypertension    Pneumonia 2010   Past Surgical History:  Procedure Laterality Date   BREAST LUMPECTOMY Right 2002   BREAST LUMPECTOMY WITH RADIOACTIVE SEED LOCALIZATION Left 02/09/2021   Procedure: LEFT BREAST LUMPECTOMY WITH RADIOACTIVE SEED LOCALIZATION (2 SEEDS);  Surgeon: [name], MD;  Location: [facility] OR;  Service: General;  Laterality: Left;   PORTACATH PLACEMENT Right 03/03/2021   Procedure: INSERTION PORT-A-CATH;  Surgeon: [name], MD;  Location: [facility];  Service: General;  Laterality: Right;   REMOVED PORTACATH     SENTINEL NODE BIOPSY Left 02/09/2021   Procedure: LEFT AXILLARY SENTINEL LYMPH NODE BIOPSY;  Surgeon: [name], MD;  Location: [facility] OR;  Service: General;  Laterality: Left;   Patient  Active Problem List   Diagnosis Date Noted   Genetic testing 02/02/2021   Family history of prostate cancer 01/22/2021   Family history of skin cancer 01/22/2021   Malignant neoplasm of upper-outer quadrant of left breast in female, estrogen receptor negative (HCC) 01/20/2021    REFERRING DIAG: left breast cancer at risk for lymphedema  THERAPY DIAG: Aftercare following surgery for neoplasm  PERTINENT HISTORY: Patient was diagnosed on 12/25/2020 with left grade III invasive ductal carcinoma breast cancer. She underwent a left lumpectomy and sentinel node biopsy (2 negative nodes) on 02/09/2021. It is triple negative with a Ki67 of 15%  PRECAUTIONS: left UE Lymphedema risk, None  SUBJECTIVE: Pt returns for her first 6 month L-Dex screen.  PAIN:  Are you having pain? No   SOZO SCREENING: Patient was assessed today using the SOZO machine to determine the lymphedema index score. This was compared to her baseline score. It was determined that she is within the recommended range when compared to her baseline and no further action is needed at this time. She will continue SOZO screenings. These are done every 3 months for 2 years post operatively followed by every 6 months for 2 years, and then annually.    L-DEX FLOWSHEETS - 06/27/23 0900       L-DEX LYMPHEDEMA SCREENING   Measurement Type Unilateral    L-DEX MEASUREMENT EXTREMITY Upper Extremity    POSITION  Standing  DOMINANT SIDE Right    At Risk Side Left    BASELINE SCORE (UNILATERAL) 1.8    L-DEX SCORE (UNILATERAL) -5.5    VALUE CHANGE (UNILAT) -7.3            P: Cont 6 month L-Dex screens next.   Denyce Flank, PTA 06/27/2023, 9:52 AM

## 2023-08-05 IMAGING — CR DG CHEST 1V PORT
1 series · 1 of 1 positions shown · non-contrast
Comparison: March 06, 2009. No recent comparison imaging aside
from prior mammograms.

CLINICAL DATA: Port-A-Cath, postoperative placement.

EXAM:
PORTABLE CHEST 1 VIEW

[chest ap]
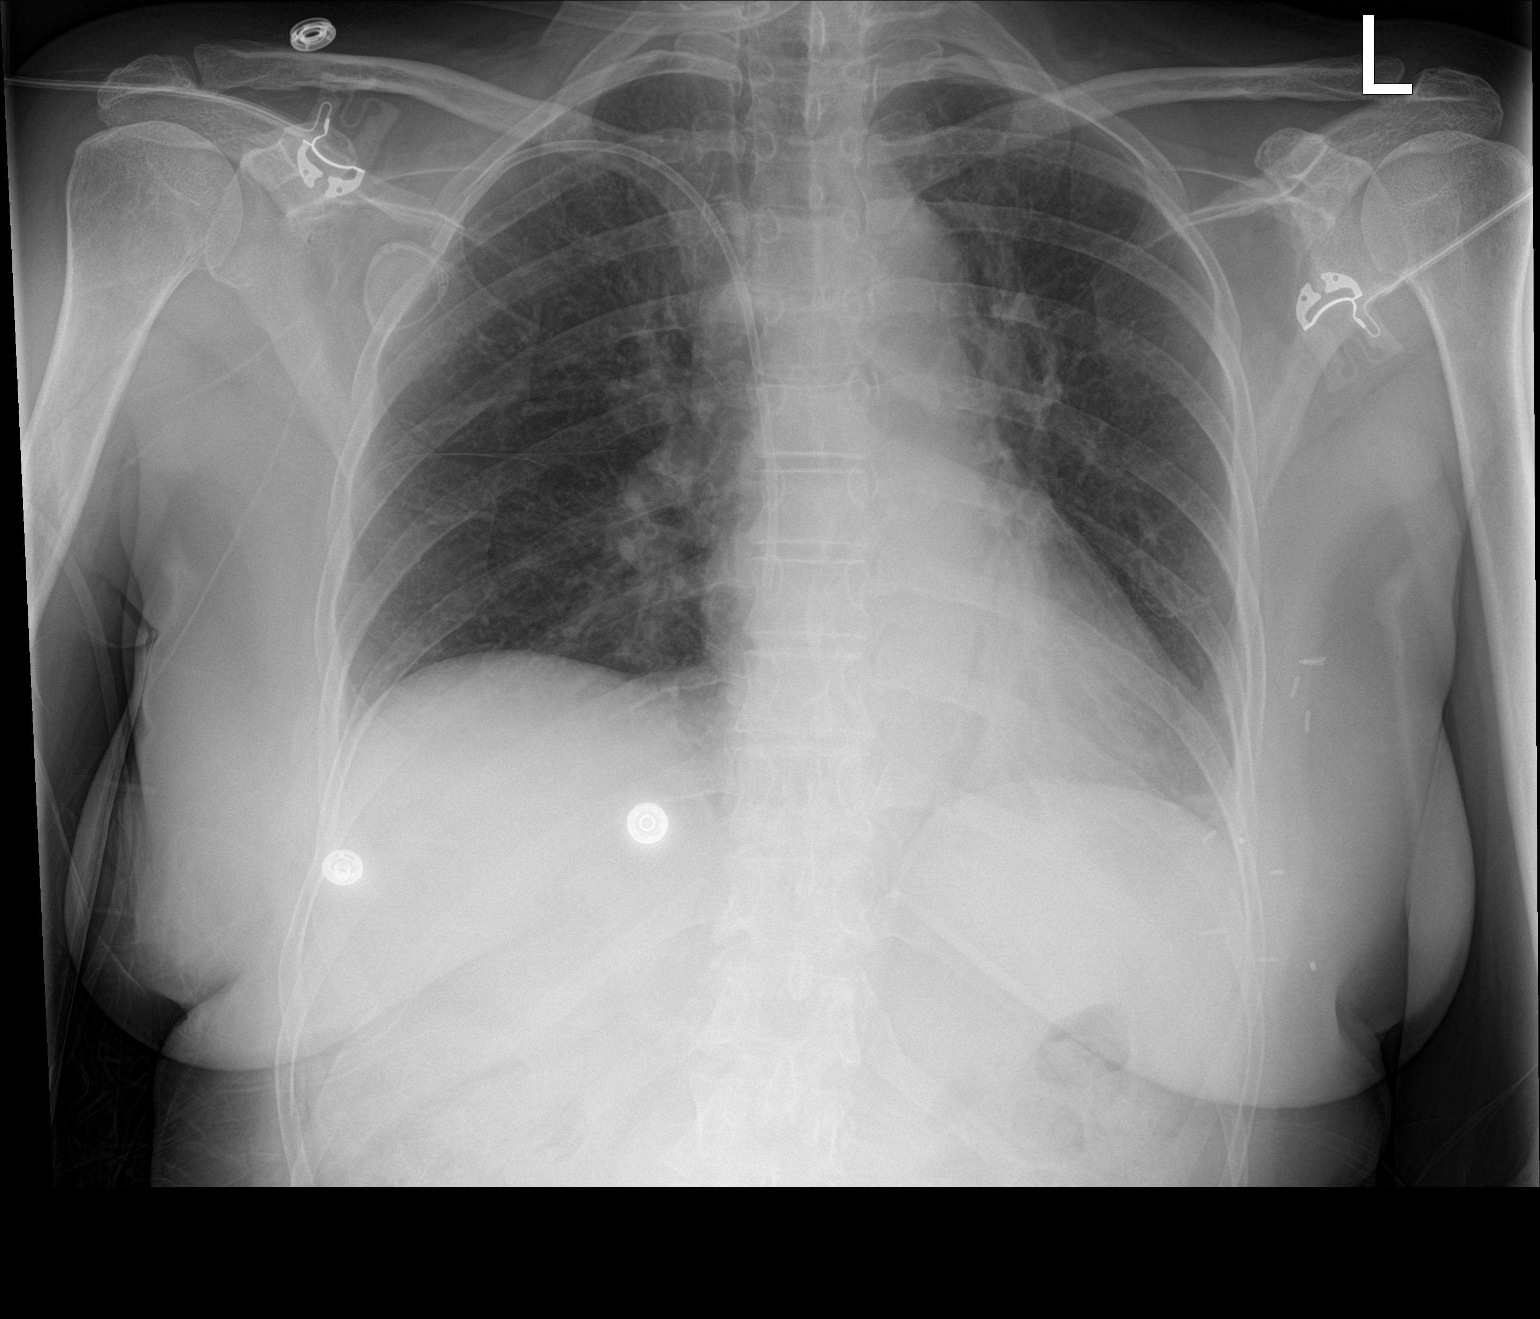

[1 of 1 positions shown; findings below may reference images not displayed]

FINDINGS: RIGHT subclavian Port-A-Cath terminates at the caval to atrial
junction.

Cardiomediastinal contours and hilar structures are normal. LEFT
retrocardiac opacity. This is subtle. No lobar consolidation. No
signs of pneumothorax.

On limited assessment there is no acute skeletal process. Surgical
clips project over the LEFT breast presumably due to prior
lumpectomy.
IMPRESSION: RIGHT subclavian Port-A-Cath terminates at the caval to atrial
junction.

No pneumothorax.

Minimal LEFT lower lobe airspace disease could reflect atelectasis.
Correlate with any respiratory symptoms.

## 2023-12-26 ENCOUNTER — Ambulatory Visit: Attending: Surgery

## 2023-12-26 DIAGNOSIS — Z483 Aftercare following surgery for neoplasm: Secondary | ICD-10-CM | POA: Insufficient documentation

## 2024-05-02 ENCOUNTER — Inpatient Hospital Stay: Payer: Medicaid Other | Admitting: Hematology and Oncology
